# Patient Record
Sex: Male | Born: 1965 | Race: White | Hispanic: No | Marital: Married | Smoking: Former smoker
Health system: Southern US, Community
[De-identification: ages and names within clinical notes are randomized; demographics above are authoritative.]

## PROBLEM LIST (undated history)

## (undated) DIAGNOSIS — K219 Gastro-esophageal reflux disease without esophagitis: Secondary | ICD-10-CM

## (undated) DIAGNOSIS — K5900 Constipation, unspecified: Secondary | ICD-10-CM

## (undated) DIAGNOSIS — R519 Headache, unspecified: Secondary | ICD-10-CM

## (undated) DIAGNOSIS — R51 Headache: Secondary | ICD-10-CM

## (undated) DIAGNOSIS — I10 Essential (primary) hypertension: Secondary | ICD-10-CM

## (undated) DIAGNOSIS — M1990 Unspecified osteoarthritis, unspecified site: Secondary | ICD-10-CM

## (undated) HISTORY — PX: UPPER GASTROINTESTINAL ENDOSCOPY: SHX188

## (undated) HISTORY — DX: Constipation, unspecified: K59.00

## (undated) HISTORY — DX: Essential (primary) hypertension: I10

## (undated) HISTORY — DX: Headache, unspecified: R51.9

## (undated) HISTORY — DX: Gastro-esophageal reflux disease without esophagitis: K21.9

## (undated) HISTORY — DX: Headache: R51

## (undated) HISTORY — DX: Unspecified osteoarthritis, unspecified site: M19.90

---

## 2004-08-30 LAB — HM COLONOSCOPY: HM COLON: NORMAL

## 2004-11-11 HISTORY — PX: COLONOSCOPY: SHX174

## 2005-06-07 ENCOUNTER — Ambulatory Visit: Payer: Self-pay | Admitting: Gastroenterology

## 2006-07-03 ENCOUNTER — Ambulatory Visit: Payer: Self-pay | Admitting: Physician Assistant

## 2013-08-03 ENCOUNTER — Ambulatory Visit: Payer: Self-pay | Admitting: Adult Health

## 2013-08-05 ENCOUNTER — Encounter: Payer: Self-pay | Admitting: Adult Health

## 2013-08-05 ENCOUNTER — Ambulatory Visit (INDEPENDENT_AMBULATORY_CARE_PROVIDER_SITE_OTHER): Payer: BC Managed Care – PPO | Admitting: Adult Health

## 2013-08-05 VITALS — BP 110/72 | HR 58 | Temp 98.0°F | Resp 12 | Ht 69.5 in | Wt 225.5 lb

## 2013-08-05 DIAGNOSIS — R9431 Abnormal electrocardiogram [ECG] [EKG]: Secondary | ICD-10-CM

## 2013-08-05 DIAGNOSIS — K5900 Constipation, unspecified: Secondary | ICD-10-CM

## 2013-08-05 DIAGNOSIS — E291 Testicular hypofunction: Secondary | ICD-10-CM

## 2013-08-05 DIAGNOSIS — N529 Male erectile dysfunction, unspecified: Secondary | ICD-10-CM | POA: Insufficient documentation

## 2013-08-05 DIAGNOSIS — R7989 Other specified abnormal findings of blood chemistry: Secondary | ICD-10-CM

## 2013-08-05 DIAGNOSIS — K219 Gastro-esophageal reflux disease without esophagitis: Secondary | ICD-10-CM

## 2013-08-05 DIAGNOSIS — R079 Chest pain, unspecified: Secondary | ICD-10-CM

## 2013-08-05 DIAGNOSIS — Z0000 Encounter for general adult medical examination without abnormal findings: Secondary | ICD-10-CM

## 2013-08-05 LAB — CBC WITH DIFFERENTIAL/PLATELET
Basophils Absolute: 0 10*3/uL (ref 0.0–0.1)
Basophils Relative: 0.5 % (ref 0.0–3.0)
Eosinophils Absolute: 0.1 10*3/uL (ref 0.0–0.7)
Hemoglobin: 14 g/dL (ref 13.0–17.0)
Lymphocytes Relative: 37 % (ref 12.0–46.0)
MCHC: 34 g/dL (ref 30.0–36.0)
Monocytes Relative: 7.1 % (ref 3.0–12.0)
Neutro Abs: 2.4 10*3/uL (ref 1.4–7.7)
Neutrophils Relative %: 52.2 % (ref 43.0–77.0)
RBC: 4.61 Mil/uL (ref 4.22–5.81)
RDW: 12.6 % (ref 11.5–14.6)

## 2013-08-05 LAB — HEPATIC FUNCTION PANEL
ALT: 33 U/L (ref 0–53)
AST: 28 U/L (ref 0–37)
Albumin: 4.2 g/dL (ref 3.5–5.2)
Total Protein: 6.9 g/dL (ref 6.0–8.3)

## 2013-08-05 LAB — TSH: TSH: 1.24 u[IU]/mL (ref 0.35–5.50)

## 2013-08-05 LAB — VITAMIN B12: Vitamin B-12: 402 pg/mL (ref 211–911)

## 2013-08-05 NOTE — Assessment & Plan Note (Signed)
Patient is well controlled on the omeprazole. Continue to follow

## 2013-08-05 NOTE — Assessment & Plan Note (Signed)
EKG done secondary to complaints of unspecific complaints of chest pain. EKG shows Q waves in leads II, III and aVF. Will refer to cardiology for further evaluation.

## 2013-08-05 NOTE — Assessment & Plan Note (Signed)
On specific complaints of chest pain occurring "on and off". EKG was abnormal showing Q waves in multiple leads. Refer to cardiology for further evaluation and possible stress test.

## 2013-08-05 NOTE — Assessment & Plan Note (Signed)
The amount of time patient reports between bowel movements is too long. He has been taking fiber supplements and stool softeners. Occasionally takes MiraLax. Discussed that he is able to take MiraLax on a daily basis and adjust dose to appropriate stool consistency. He may take the MiraLax every other day or every 2 days but going for 2 weeks without a bowel movement is too long. Patient agreed and will try the MiraLax more regularly.
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
## 2013-08-05 NOTE — Assessment & Plan Note (Signed)
Physical exam was normal except for problems listed separately. Patient had health screening through his wife's insurance. Labs show hemoglobin A1c 5.7 (prediabetes), creatinine and GFR were normal, lipids were normal, BMI 33 considered obesity. I will order CBC with differential, TSH, B12, hepatic panel. Patient is being referred to cardiology for abnormal EKG. Referral to urology for erectile dysfunction and low testosterone.

## 2013-08-05 NOTE — Assessment & Plan Note (Signed)
Patient has been experiencing issues with erectile dysfunction. He had his testosterone checked with his previous PCP and patient was diagnosed with low testosterone and started on replacement therapy. He reacted significantly to the testosterone and has discontinued medication. He is no longer interested in replacing testosterone but is interested in discussing other options regarding his erectile dysfunction. Referral to urology.

## 2013-08-06 ENCOUNTER — Encounter: Payer: Self-pay | Admitting: Cardiovascular Disease

## 2013-08-06 ENCOUNTER — Ambulatory Visit (INDEPENDENT_AMBULATORY_CARE_PROVIDER_SITE_OTHER): Payer: BC Managed Care – PPO | Admitting: Cardiovascular Disease

## 2013-08-06 ENCOUNTER — Encounter: Payer: Self-pay | Admitting: *Deleted

## 2013-08-06 VITALS — BP 122/98 | HR 63 | Ht 69.5 in | Wt 224.8 lb

## 2013-08-06 DIAGNOSIS — R9431 Abnormal electrocardiogram [ECG] [EKG]: Secondary | ICD-10-CM

## 2013-08-06 DIAGNOSIS — R079 Chest pain, unspecified: Secondary | ICD-10-CM

## 2013-08-06 NOTE — Patient Instructions (Addendum)
Your physician has requested that you have an echocardiogram. Echocardiography is a painless test that uses sound waves to create images of your heart. It provides your doctor with information about the size and shape of your heart and how well your heart's chambers and valves are working. This procedure takes approximately one hour. There are no restrictions for this procedure.  Follow up as needed 

## 2013-08-10 ENCOUNTER — Encounter: Payer: Self-pay | Admitting: Cardiovascular Disease

## 2013-08-10 NOTE — Progress Notes (Signed)
Thank you for seeing Austin [name].

## 2013-08-10 NOTE — Assessment & Plan Note (Signed)
His chest pain seems to be noncardiac in suggestive of musculoskeletal etiology. He has been doing high level of exercise with no exertional symptoms. Thus, the utility of stress testing is low.
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
## 2013-08-10 NOTE — Assessment & Plan Note (Signed)
He has borderline inferior Q waves. It is mostly prominent in the 3. The Q waves in lead 2 and aVF might not be pathologic. I will request an echocardiogram to evaluate wall motion and LV systolic function.

## 2013-08-13 ENCOUNTER — Other Ambulatory Visit: Payer: Self-pay

## 2013-08-13 ENCOUNTER — Other Ambulatory Visit (INDEPENDENT_AMBULATORY_CARE_PROVIDER_SITE_OTHER): Payer: BC Managed Care – PPO

## 2013-08-13 DIAGNOSIS — R079 Chest pain, unspecified: Secondary | ICD-10-CM

## 2013-08-13 DIAGNOSIS — R9431 Abnormal electrocardiogram [ECG] [EKG]: Secondary | ICD-10-CM

## 2013-08-18 ENCOUNTER — Telehealth: Payer: Self-pay

## 2013-08-18 NOTE — Telephone Encounter (Signed)
Left detailed message on home machine of results. Asked pt to call with any questions or concerns. 

## 2013-08-18 NOTE — Telephone Encounter (Signed)
Message copied by Marilynne Halsted on Wed Aug 18, 2013  9:07 AM ------      Message from: Lorine Bears A      Created: Tue Aug 17, 2013  5:36 PM       Inform patient that echo was normal. ------

## 2013-08-26 DIAGNOSIS — E291 Testicular hypofunction: Secondary | ICD-10-CM | POA: Insufficient documentation

## 2013-08-26 DIAGNOSIS — N401 Enlarged prostate with lower urinary tract symptoms: Secondary | ICD-10-CM | POA: Insufficient documentation

## 2013-08-31 ENCOUNTER — Other Ambulatory Visit: Payer: Self-pay | Admitting: Adult Health

## 2013-08-31 MED ORDER — DOCUSATE SODIUM 100 MG PO CAPS: 100.0000 mg | ORAL_CAPSULE | Freq: Every day | ORAL | Status: DC

## 2013-08-31 MED ORDER — OMEPRAZOLE 20 MG PO CPDR: 20.0000 mg | DELAYED_RELEASE_CAPSULE | Freq: Every day | ORAL | Status: DC

## 2013-08-31 MED ORDER — PSYLLIUM 0.52 G PO CAPS: 0.5200 g | ORAL_CAPSULE | Freq: Every day | ORAL | Status: DC

## 2013-08-31 MED ORDER — MELOXICAM 7.5 MG PO TABS: 7.5000 mg | ORAL_TABLET | Freq: Two times a day (BID) | ORAL | Status: DC

## 2013-08-31 MED ORDER — POLYETHYLENE GLYCOL 3350 17 G PO PACK: 17.0000 g | PACK | Freq: Every day | ORAL | Status: DC | PRN

## 2013-08-31 NOTE — Telephone Encounter (Signed)
Wife called back, stating Rxs for fiber, stool softener, and Miralax needed to go to [facility] and Omeprazole and Meloxicam to [facility]

## 2013-08-31 NOTE — Telephone Encounter (Signed)
States she has left msgs on vm regarding refills.  Pt was recently new to [place] and is needing refills on his meds.  Previously prescribed meds by Dr. Robb Matar.  States pt turned in a list at his appointment that included where the prescriptions and where they were to be called in to.  States OTC meds still need a script so she can use her flex card including miralax so she can use her flex card to pay for them.  Please contact Inetta Fermo (wife) regarding the refills.  Please also contact to let Inetta Fermo know when this has been done.  States he is very low on meds.

## 2013-08-31 NOTE — Telephone Encounter (Signed)
Yes Ok to refill.

## 2013-08-31 NOTE — Telephone Encounter (Signed)
Left message for pt's wife to return my call, regarding pharmacy Rxs need to go to.

## 2013-08-31 NOTE — Telephone Encounter (Signed)
Ok to refill Meloxicam to [facility]?

## 2013-09-07 ENCOUNTER — Telehealth: Payer: Self-pay | Admitting: *Deleted

## 2013-09-07 NOTE — Telephone Encounter (Signed)
Pt has not been evaluated for this. I cannot prescribe without seeing him.

## 2013-09-07 NOTE — Telephone Encounter (Signed)
Pt's wife called, states pt has a history of dry mouth in mornings, occasionally will get ulcers in his mouth. Occurs off and on for years, lasts 1-2 weeks when they occur. She is asking for [facility] mouthwash to be called in without pt having to be seen. He has been gargling with salt water but no relief.

## 2013-09-08 ENCOUNTER — Ambulatory Visit (INDEPENDENT_AMBULATORY_CARE_PROVIDER_SITE_OTHER): Payer: BC Managed Care – PPO | Admitting: Adult Health

## 2013-09-08 ENCOUNTER — Encounter: Payer: Self-pay | Admitting: Adult Health

## 2013-09-08 ENCOUNTER — Telehealth: Payer: Self-pay | Admitting: Emergency Medicine

## 2013-09-08 VITALS — BP 110/76 | HR 92 | Temp 98.1°F | Resp 12 | Wt 224.0 lb

## 2013-09-08 DIAGNOSIS — K120 Recurrent oral aphthae: Secondary | ICD-10-CM

## 2013-09-08 MED ORDER — FIRST-DUKES MOUTHWASH MT SUSP: OROMUCOSAL | Status: DC

## 2013-09-08 NOTE — Progress Notes (Signed)
  Subjective:    Patient ID: Austin [name], male    DOB: [birth year], 47 y.o.   MRN: [id number]  HPI  Patient is a pleasant 47 year old male who reports painful, small ulcerations inside his mouth. He has had 3 total in the past week. Two have healed. The one underneath his tongue is still very painful. He has been using Chloraseptic to help numb the area. He reports that he used to get a lot of these ulcerations as a child. He has not gotten many episodes of these as an adult. He denies recent illness. He also denies any unusual foods that may have been a trigger. He reports being under considerable amount of stress related to work.  Current Outpatient Prescriptions on File Prior to Visit  Medication Sig Dispense Refill  . B Complex-C (SUPER B COMPLEX PO) Take 1 tablet by mouth daily.      Marland Kitchen docusate sodium (COLACE) 100 MG capsule Take 1 capsule (100 mg total) by mouth daily.  30 capsule  5  . ibuprofen (ADVIL,MOTRIN) 200 MG tablet Take 200 mg by mouth every 6 (six) hours as needed for pain.      . meloxicam (MOBIC) 7.5 MG tablet Take 1 tablet (7.5 mg total) by mouth 2 (two) times daily.  180 tablet  1  . omeprazole (PRILOSEC) 20 MG capsule Take 1 capsule (20 mg total) by mouth daily.  90 capsule  1  . polyethylene glycol (MIRALAX / GLYCOLAX) packet Take 17 g by mouth daily as needed.  14 each  5  . psyllium (EQ FIBER THERAPY) 0.52 G capsule Take 1 capsule (0.52 g total) by mouth daily.  30 capsule  5   No current facility-administered medications on file prior to visit.     Review of Systems  Constitutional: Negative.   HENT: Positive for mouth sores.   Gastrointestinal: Negative.        Objective:   Physical Exam  Constitutional: He is oriented to person, place, and time. He appears well-developed and well-nourished. No distress.  HENT:  Aphthous ulcer underneath tongue  Neurological: He is alert and oriented to person, place, and time.  Skin: Skin is warm and dry.  Psychiatric: He  has a normal mood and affect. His behavior is normal. Judgment and thought content normal.          Assessment & Plan:

## 2013-09-08 NOTE — Assessment & Plan Note (Signed)
[facility] mouth wash swish and spit 4 times a day. May use Anbesol or Orajel to numb the area prior to eating.

## 2013-09-08 NOTE — Telephone Encounter (Signed)
Left message for pt, advising to call for appointment to be evaluated.

## 2013-09-08 NOTE — Telephone Encounter (Signed)
Pt wife aware patient needs to come in before anything can be called in for his mouth. Pt has an apt at 415 today.

## 2013-09-08 NOTE — Patient Instructions (Signed)
  Rinse mouth 4 times a day with [facility] Mouthwash.  Use orajel or anbesol to numb the area prior to eating.

## 2013-09-09 ENCOUNTER — Other Ambulatory Visit: Payer: Self-pay | Admitting: *Deleted

## 2013-09-09 ENCOUNTER — Ambulatory Visit: Payer: BC Managed Care – PPO | Admitting: Adult Health

## 2013-09-16 ENCOUNTER — Other Ambulatory Visit: Payer: Self-pay

## 2013-11-10 ENCOUNTER — Other Ambulatory Visit: Payer: Self-pay | Admitting: Adult Health

## 2013-11-15 ENCOUNTER — Encounter: Payer: Self-pay | Admitting: Adult Health

## 2014-01-11 ENCOUNTER — Other Ambulatory Visit: Payer: Self-pay | Admitting: Adult Health

## 2014-01-11 ENCOUNTER — Encounter: Payer: Self-pay | Admitting: Internal Medicine

## 2014-01-11 ENCOUNTER — Ambulatory Visit (INDEPENDENT_AMBULATORY_CARE_PROVIDER_SITE_OTHER): Payer: BC Managed Care – PPO | Admitting: Internal Medicine

## 2014-01-11 VITALS — BP 120/90 | HR 88 | Temp 98.1°F | Wt 226.0 lb

## 2014-01-11 DIAGNOSIS — A088 Other specified intestinal infections: Secondary | ICD-10-CM

## 2014-01-11 DIAGNOSIS — A084 Viral intestinal infection, unspecified: Secondary | ICD-10-CM

## 2014-01-11 MED ORDER — ONDANSETRON HCL 8 MG PO TABS: 8.0000 mg | ORAL_TABLET | Freq: Three times a day (TID) | ORAL | Status: DC | PRN

## 2014-01-11 MED ORDER — OMEPRAZOLE 20 MG PO CPDR: 20.0000 mg | DELAYED_RELEASE_CAPSULE | Freq: Every day | ORAL | Status: DC

## 2014-01-11 MED ORDER — MELOXICAM 7.5 MG PO TABS
7.5000 mg | ORAL_TABLET | Freq: Two times a day (BID) | ORAL | Status: DC
Start: 2014-01-11 — End: 2014-09-15

## 2014-01-11 NOTE — Patient Instructions (Signed)
Viral Gastroenteritis Viral gastroenteritis is also known as stomach flu. This condition affects the stomach and intestinal tract. It can cause sudden diarrhea and vomiting. The illness typically lasts 3 to 8 days. Most people develop an immune response that eventually gets rid of the virus. While this natural response develops, the virus can make you quite ill. CAUSES  Many different viruses can cause gastroenteritis, such as rotavirus or noroviruses. You can catch one of these viruses by consuming contaminated food or water. You may also catch a virus by sharing utensils or other personal items with an infected person or by touching a contaminated surface. SYMPTOMS  The most common symptoms are diarrhea and vomiting. These problems can cause a severe loss of body fluids (dehydration) and a body salt (electrolyte) imbalance. Other symptoms may include:  Fever.  Headache.  Fatigue.  Abdominal pain. DIAGNOSIS  Your caregiver can usually diagnose viral gastroenteritis based on your symptoms and a physical exam. A stool sample may also be taken to test for the presence of viruses or other infections. TREATMENT  This illness typically goes away on its own. Treatments are aimed at rehydration. The most serious cases of viral gastroenteritis involve vomiting so severely that you are not able to keep fluids down. In these cases, fluids must be given through an intravenous line (IV). HOME CARE INSTRUCTIONS   Drink enough fluids to keep your urine clear or pale yellow. Drink small amounts of fluids frequently and increase the amounts as tolerated.  Ask your caregiver for specific rehydration instructions.  Avoid:  Foods high in sugar.  Alcohol.  Carbonated drinks.  Tobacco.  Juice.  Caffeine drinks.  Extremely hot or cold fluids.  Fatty, greasy foods.  Too much intake of anything at one time.  Dairy products until 24 to 48 hours after diarrhea stops.  You may consume probiotics.  Probiotics are active cultures of beneficial bacteria. They may lessen the amount and number of diarrheal stools in adults. Probiotics can be found in yogurt with active cultures and in supplements.  Wash your hands well to avoid spreading the virus.  Only take over-the-counter or prescription medicines for pain, discomfort, or fever as directed by your caregiver. Do not give aspirin to children. Antidiarrheal medicines are not recommended.  Ask your caregiver if you should continue to take your regular prescribed and over-the-counter medicines.  Keep all follow-up appointments as directed by your caregiver. SEEK IMMEDIATE MEDICAL CARE IF:   You are unable to keep fluids down.  You do not urinate at least once every 6 to 8 hours.  You develop shortness of breath.  You notice blood in your stool or vomit. This may look like coffee grounds.  You have abdominal pain that increases or is concentrated in one small area (localized).  You have persistent vomiting or diarrhea.  You have a fever.  The patient is a child younger than 3 months, and he or she has a fever.  The patient is a child older than 3 months, and he or she has a fever and persistent symptoms.  The patient is a child older than 3 months, and he or she has a fever and symptoms suddenly get worse.  The patient is a baby, and he or she has no tears when crying. MAKE SURE YOU:   Understand these instructions.  Will watch your condition.  Will get help right away if you are not doing well or get worse. Document Released: 10/28/2005 Document Revised: 01/20/2012 Document Reviewed: 08/14/2011   Exit[employer] Patient Information 2014 [employer]  

## 2014-01-11 NOTE — Progress Notes (Signed)
   Subjective:    Patient ID: Austin [name], male    DOB: 1965-12-15, 48 y.o.   MRN: [id number]  HPI 48YO male presents for acute visit. Abdominal pain and vomiting starting last night. Emesis with food particles. Temp 101F. Slept through the night. Stomach pain improved today. No diarrhea. Not taking anything for nausea. Took Mucinex. Tolerating liquids this morning, however appetite poor. Coworker was sick with similar symptoms 2-3 days ago.  Review of Systems  Constitutional: Positive for fever, appetite change and fatigue. Negative for chills, activity change and unexpected weight change.  HENT: Negative for congestion and postnasal drip.   Eyes: Negative for visual disturbance.  Respiratory: Negative for cough and shortness of breath.   Cardiovascular: Negative for chest pain, palpitations and leg swelling.  Gastrointestinal: Positive for nausea and vomiting. Negative for abdominal pain, diarrhea, constipation, blood in stool and abdominal distention.  Genitourinary: Negative for dysuria, urgency and difficulty urinating.  Musculoskeletal: Negative for arthralgias and gait problem.  Skin: Negative for color change and rash.  Hematological: Negative for adenopathy.  Psychiatric/Behavioral: Negative for sleep disturbance and dysphoric mood. The patient is not nervous/anxious.        Objective:    BP 120/90  Pulse 88  Temp(Src) 98.1 F (36.7 C) (Oral)  Wt 226 lb (102.513 kg)  SpO2 96% Physical Exam  Constitutional: He is oriented to person, place, and time. He appears well-developed and well-nourished. No distress.  HENT:  Head: Normocephalic and atraumatic.  Right Ear: External ear normal.  Left Ear: External ear normal.  Nose: Nose normal.  Mouth/Throat: Oropharynx is clear and moist. No oropharyngeal exudate.  Eyes: Conjunctivae and EOM are normal. Pupils are equal, round, and reactive to light. Right eye exhibits no discharge. Left eye exhibits no discharge. No scleral  icterus.  Neck: Normal range of motion. Neck supple. No tracheal deviation present. No thyromegaly present.  Cardiovascular: Normal rate, regular rhythm and normal heart sounds.  Exam reveals no gallop and no friction rub.   No murmur heard. Pulmonary/Chest: Effort normal and breath sounds normal. No accessory muscle usage. Not tachypneic. No respiratory distress. He has no decreased breath sounds. He has no wheezes. He has no rhonchi. He has no rales. He exhibits no tenderness.  Abdominal: Soft. Bowel sounds are normal. He exhibits no distension and no mass. There is tenderness (diffuse mild). There is no rebound and no guarding.  Musculoskeletal: Normal range of motion. He exhibits no edema.  Lymphadenopathy:    He has no cervical adenopathy.  Neurological: He is alert and oriented to person, place, and time. No cranial nerve deficit. Coordination normal.  Skin: Skin is warm and dry. No rash noted. He is not diaphoretic. No erythema. No pallor.  Psychiatric: He has a normal mood and affect. His behavior is normal. Judgment and thought content normal.          Assessment & Plan:   Problem List Items Addressed This Visit   Viral gastroenteritis - Primary     Symptoms and exam most consistent with viral gastroenteritis. Encouraged rest, fluid intake as tolerated. Will start Ondansetron for nausea. Follow up if symptoms persistent or worsening, or if unable to tolerate liquids.    Relevant Medications      ondansetron (ZOFRAN) tablet       Return if symptoms worsen or fail to improve.

## 2014-01-12 DIAGNOSIS — A084 Viral intestinal infection, unspecified: Secondary | ICD-10-CM | POA: Insufficient documentation

## 2014-01-12 NOTE — Assessment & Plan Note (Signed)
Symptoms and exam most consistent with viral gastroenteritis. Encouraged rest, fluid intake as tolerated. Will start Ondansetron for nausea. Follow up if symptoms persistent or worsening, or if unable to tolerate liquids.

## 2014-06-07 ENCOUNTER — Encounter: Payer: Self-pay | Admitting: Adult Health

## 2014-06-07 ENCOUNTER — Ambulatory Visit (INDEPENDENT_AMBULATORY_CARE_PROVIDER_SITE_OTHER): Payer: BC Managed Care – PPO | Admitting: Adult Health

## 2014-06-07 VITALS — BP 132/87 | HR 61 | Temp 97.8°F | Resp 14 | Ht 69.5 in | Wt 220.5 lb

## 2014-06-07 DIAGNOSIS — J020 Streptococcal pharyngitis: Secondary | ICD-10-CM

## 2014-06-07 DIAGNOSIS — K120 Recurrent oral aphthae: Secondary | ICD-10-CM

## 2014-06-07 LAB — POCT RAPID STREP A (OFFICE): RAPID STREP A SCREEN: NEGATIVE

## 2014-06-07 MED ORDER — NYSTATIN 100000 UNIT/ML MT SUSP: 5.0000 mL | Freq: Four times a day (QID) | OROMUCOSAL | Status: DC

## 2014-06-07 MED ORDER — FIRST-DUKES MOUTHWASH MT SUSP: OROMUCOSAL | Status: DC

## 2014-06-07 NOTE — Progress Notes (Signed)
Pre visit review using our clinic review tool, if applicable. No additional management support is needed unless otherwise documented below in the visit note. 
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
## 2014-06-10 ENCOUNTER — Other Ambulatory Visit: Payer: Self-pay | Admitting: Adult Health

## 2014-06-10 ENCOUNTER — Encounter: Payer: Self-pay | Admitting: Adult Health

## 2014-06-10 MED ORDER — AMOXICILLIN-POT CLAVULANATE 875-125 MG PO TABS: 1.0000 | ORAL_TABLET | Freq: Two times a day (BID) | ORAL | Status: DC

## 2014-08-30 ENCOUNTER — Encounter: Payer: Self-pay | Admitting: Internal Medicine

## 2014-08-30 ENCOUNTER — Ambulatory Visit (INDEPENDENT_AMBULATORY_CARE_PROVIDER_SITE_OTHER): Payer: BC Managed Care – PPO | Admitting: Internal Medicine

## 2014-08-30 VITALS — BP 118/82 | HR 70 | Temp 97.6°F | Ht 69.5 in | Wt 215.8 lb

## 2014-08-30 DIAGNOSIS — K5900 Constipation, unspecified: Secondary | ICD-10-CM

## 2014-08-30 DIAGNOSIS — E669 Obesity, unspecified: Secondary | ICD-10-CM | POA: Insufficient documentation

## 2014-08-30 DIAGNOSIS — Z23 Encounter for immunization: Secondary | ICD-10-CM

## 2014-08-30 MED ORDER — LINACLOTIDE 145 MCG PO CAPS: 145.0000 ug | ORAL_CAPSULE | Freq: Every day | ORAL | Status: DC

## 2014-08-30 MED ORDER — POLYETHYLENE GLYCOL 3350 17 G PO PACK: 17.0000 g | PACK | Freq: Every day | ORAL | Status: DC | PRN

## 2014-08-30 NOTE — Assessment & Plan Note (Signed)
Wt Readings from Last 3 Encounters:  08/30/14 215 lb 12 oz (97.864 kg)  06/07/14 220 lb 8 oz (100.018 kg)  01/11/14 226 lb (102.513 kg)   Body mass index is 31.41 kg/(m^2). Encouraged healthy diet and exercise. Goal 10lb weight loss over 3 months.

## 2014-08-30 NOTE — Progress Notes (Signed)
Pre visit review using our clinic review tool, if applicable. No additional management support is needed unless otherwise documented below in the visit note. 

## 2014-08-30 NOTE — Progress Notes (Signed)
Subjective:    Patient ID: Austin [name], male    DOB: Dec 09, 1965, 48 y.o.   MRN: [id number]  HPI 48YO male presents to establish care.  Obesity - started lifting weights 3 nights per week and exercising to lose weight. Following Weight Watchers diet with wife. Lost 5lbs.  Constipation- Has chronic issues with constipation. Taking Miralax daily, fiber supplement and has increased fluid intake with no improvement. Typically, has BMP 1x per week. No blood in stool. No black stool. Previous colonoscopy to evaluate this in approx 2005 was normal.  Review of Systems  Constitutional: Negative for fever, chills, activity change, appetite change, fatigue and unexpected weight change.  Eyes: Negative for visual disturbance.  Respiratory: Negative for cough and shortness of breath.   Cardiovascular: Negative for chest pain, palpitations and leg swelling.  Gastrointestinal: Positive for constipation. Negative for nausea, vomiting, abdominal pain, diarrhea, blood in stool and abdominal distention.  Genitourinary: Negative for dysuria, urgency and difficulty urinating.  Musculoskeletal: Negative for arthralgias and gait problem.  Skin: Negative for color change and rash.  Hematological: Negative for adenopathy.  Psychiatric/Behavioral: Negative for sleep disturbance and dysphoric mood. The patient is not nervous/anxious.        Objective:    BP 118/82  Pulse 70  Temp(Src) 97.6 F (36.4 C) (Oral)  Ht 5' 9.5" (1.765 m)  Wt 215 lb 12 oz (97.864 kg)  BMI 31.41 kg/m2  SpO2 97% Physical Exam  Constitutional: He is oriented to person, place, and time. He appears well-developed and well-nourished. No distress.  HENT:  Head: Normocephalic and atraumatic.  Right Ear: External ear normal.  Left Ear: External ear normal.  Nose: Nose normal.  Mouth/Throat: Oropharynx is clear and moist. No oropharyngeal exudate.  Eyes: Conjunctivae and EOM are normal. Pupils are equal, round, and reactive to light.  Right eye exhibits no discharge. Left eye exhibits no discharge. No scleral icterus.  Neck: Normal range of motion. Neck supple. No tracheal deviation present. No thyromegaly present.  Cardiovascular: Normal rate, regular rhythm and normal heart sounds.  Exam reveals no gallop and no friction rub.   No murmur heard. Pulmonary/Chest: Effort normal and breath sounds normal. No accessory muscle usage. Not tachypneic. No respiratory distress. He has no decreased breath sounds. He has no wheezes. He has no rhonchi. He has no rales. He exhibits no tenderness.  Musculoskeletal: Normal range of motion. He exhibits no edema.  Lymphadenopathy:    He has no cervical adenopathy.  Neurological: He is alert and oriented to person, place, and time. No cranial nerve deficit. Coordination normal.  Skin: Skin is warm and dry. No rash noted. He is not diaphoretic. No erythema. No pallor.  Psychiatric: He has a normal mood and affect. His behavior is normal. Judgment and thought content normal.          Assessment & Plan:   Problem List Items Addressed This Visit     Unprioritized   Constipation - Primary     Chronic constipation. Will try adding Linzess. Follow up by email and in 3 months.    Obesity (BMI 30-39.9)      Wt Readings from Last 3 Encounters:  08/30/14 215 lb 12 oz (97.864 kg)  06/07/14 220 lb 8 oz (100.018 kg)  01/11/14 226 lb (102.513 kg)   Body mass index is 31.41 kg/(m^2). Encouraged healthy diet and exercise. Goal 10lb weight loss over 3 months.        Return in about 3 months (around 11/30/2014)  for Recheck.

## 2014-08-30 NOTE — Assessment & Plan Note (Signed)
Chronic constipation. Will try adding Linzess. Follow up by email and in 3 months.

## 2014-08-30 NOTE — Patient Instructions (Signed)
Start Linzess 123mcg daily to help with constipation.  Flu vaccine today.

## 2014-09-02 ENCOUNTER — Encounter: Payer: Self-pay | Admitting: *Deleted

## 2014-09-02 ENCOUNTER — Encounter: Payer: Self-pay | Admitting: Internal Medicine

## 2014-09-15 ENCOUNTER — Other Ambulatory Visit: Payer: Self-pay | Admitting: *Deleted

## 2014-09-15 MED ORDER — OMEPRAZOLE 20 MG PO CPDR: 20.0000 mg | DELAYED_RELEASE_CAPSULE | Freq: Every day | ORAL | Status: DC

## 2014-09-15 MED ORDER — MELOXICAM 7.5 MG PO TABS: 7.5000 mg | ORAL_TABLET | Freq: Two times a day (BID) | ORAL | Status: DC

## 2014-09-15 NOTE — Telephone Encounter (Signed)
Ok refill? 

## 2014-09-19 ENCOUNTER — Telehealth: Payer: Self-pay

## 2014-09-19 ENCOUNTER — Other Ambulatory Visit: Payer: Self-pay

## 2014-09-19 NOTE — Telephone Encounter (Signed)
Add tramadol 50mg  to patient's medication list as requested.

## 2014-09-20 ENCOUNTER — Telehealth: Payer: Self-pay | Admitting: *Deleted

## 2014-09-20 MED ORDER — TRAMADOL HCL 50 MG PO TABS: 50.0000 mg | ORAL_TABLET | ORAL | Status: DC | PRN

## 2014-09-20 NOTE — Telephone Encounter (Signed)
This is historical medication, need the quantity and number of refills

## 2014-09-20 NOTE — Telephone Encounter (Signed)
Pt called requesting a refill on Tramadol, this is a historical medication, Okay to fill?

## 2014-09-20 NOTE — Telephone Encounter (Signed)
Fine to refill 

## 2014-09-27 ENCOUNTER — Ambulatory Visit: Payer: BC Managed Care – PPO | Admitting: Internal Medicine

## 2014-11-30 ENCOUNTER — Ambulatory Visit (INDEPENDENT_AMBULATORY_CARE_PROVIDER_SITE_OTHER): Payer: BC Managed Care – PPO | Admitting: Internal Medicine

## 2014-11-30 ENCOUNTER — Encounter: Payer: Self-pay | Admitting: Internal Medicine

## 2014-11-30 VITALS — BP 113/76 | HR 60 | Temp 97.4°F | Ht 69.5 in | Wt 224.2 lb

## 2014-11-30 DIAGNOSIS — K219 Gastro-esophageal reflux disease without esophagitis: Secondary | ICD-10-CM

## 2014-11-30 DIAGNOSIS — K5900 Constipation, unspecified: Secondary | ICD-10-CM

## 2014-11-30 DIAGNOSIS — E669 Obesity, unspecified: Secondary | ICD-10-CM

## 2014-11-30 MED ORDER — FIRST-DUKES MOUTHWASH MT SUSP: OROMUCOSAL | Status: DC

## 2014-11-30 NOTE — Progress Notes (Signed)
Subjective:    Patient ID: Austin [name], male    DOB: 27-Sep-1966, 49 y.o.   MRN: [id number]  HPI 49YO male presents for follow up.  Joined a local gym at [employer] and has been following a healthy diet. Going to gym about 4 days per week.  Constipation- Continues to struggle with constipation. Has a BM about every 4 days. At times has gone up to 2 weeks. Slightly improved with Linzess. Also taking Miralax and fiber supplements. Has tried to increase fluid intake.  Wt Readings from Last 3 Encounters:  11/30/14 224 lb 4 oz (101.719 kg)  08/30/14 215 lb 12 oz (97.864 kg)  06/07/14 220 lb 8 oz (100.018 kg)     Past medical, surgical, family and social history per today's encounter.  Review of Systems  Constitutional: Negative for fever, chills, activity change, appetite change, fatigue and unexpected weight change.  Eyes: Negative for visual disturbance.  Respiratory: Negative for cough and shortness of breath.   Cardiovascular: Negative for chest pain, palpitations and leg swelling.  Gastrointestinal: Positive for constipation. Negative for nausea, vomiting, abdominal pain, diarrhea, blood in stool, abdominal distention, anal bleeding and rectal pain.  Genitourinary: Negative for dysuria, urgency and difficulty urinating.  Musculoskeletal: Negative for arthralgias and gait problem.  Skin: Negative for color change and rash.  Hematological: Negative for adenopathy.  Psychiatric/Behavioral: Negative for sleep disturbance and dysphoric mood. The patient is not nervous/anxious.        Objective:    BP 113/76 mmHg  Pulse 60  Temp(Src) 97.4 F (36.3 C) (Oral)  Ht 5' 9.5" (1.765 m)  Wt 224 lb 4 oz (101.719 kg)  BMI 32.65 kg/m2  SpO2 96% Physical Exam  Constitutional: He is oriented to person, place, and time. He appears well-developed and well-nourished. No distress.  HENT:  Head: Normocephalic and atraumatic.  Right Ear: External ear normal.  Left Ear: External ear  normal.  Nose: Nose normal.  Mouth/Throat: Oropharynx is clear and moist. No oropharyngeal exudate.  Eyes: Conjunctivae and EOM are normal. Pupils are equal, round, and reactive to light. Right eye exhibits no discharge. Left eye exhibits no discharge. No scleral icterus.  Neck: Normal range of motion. Neck supple. No tracheal deviation present. No thyromegaly present.  Cardiovascular: Normal rate, regular rhythm and normal heart sounds.  Exam reveals no gallop and no friction rub.   No murmur heard. Pulmonary/Chest: Effort normal and breath sounds normal. No respiratory distress. He has no wheezes. He has no rales. He exhibits no tenderness.  Abdominal: Soft. Bowel sounds are normal. He exhibits no distension and no mass. There is tenderness (slight left lower quad). There is no rebound and no guarding.  Musculoskeletal: Normal range of motion. He exhibits no edema.  Lymphadenopathy:    He has no cervical adenopathy.  Neurological: He is alert and oriented to person, place, and time. No cranial nerve deficit. Coordination normal.  Skin: Skin is warm and dry. No rash noted. He is not diaphoretic. No erythema. No pallor.  Psychiatric: He has a normal mood and affect. His behavior is normal. Judgment and thought content normal.          Assessment & Plan:   Problem List Items Addressed This Visit      Unprioritized   Constipation - Primary    Symptoms of constipation persistent despite adding Linzess 126mcg daily. Will try increase to 257mcg daily, on weekends when he has better access to bathroom. He will follow up by email  next week. Discussed adding Amitiza and also discussed referral to GI for follow up colonoscopy. He declines referral for now.      GERD (gastroesophageal reflux disease)    Symptoms well controlled with omeprazole. Will continue.      Obesity (BMI 30-39.9)    Wt Readings from Last 3 Encounters:  11/30/14 224 lb 4 oz (101.719 kg)  08/30/14 215 lb 12 oz (97.864  kg)  06/07/14 220 lb 8 oz (100.018 kg)   Body mass index is 32.65 kg/(m^2). Encouraged continued efforts at healthy diet and exercise.          Return in about 5 months (around 05/01/2015) for Physical.

## 2014-11-30 NOTE — Assessment & Plan Note (Signed)
Symptoms well controlled with omeprazole. Will continue.

## 2014-11-30 NOTE — Assessment & Plan Note (Signed)
Symptoms of constipation persistent despite adding Linzess 124mcg daily. Will try increase to 252mcg daily, on weekends when he has better access to bathroom. He will follow up by email next week. Discussed adding Amitiza and also discussed referral to GI for follow up colonoscopy. He declines referral for now.

## 2014-11-30 NOTE — Patient Instructions (Addendum)
Try increasing Linzess to 234mcg daily on the weekends.  Email with update.  Consider colonoscopy.

## 2014-11-30 NOTE — Assessment & Plan Note (Signed)
Wt Readings from Last 3 Encounters:  11/30/14 224 lb 4 oz (101.719 kg)  08/30/14 215 lb 12 oz (97.864 kg)  06/07/14 220 lb 8 oz (100.018 kg)   Body mass index is 32.65 kg/(m^2). Encouraged continued efforts at healthy diet and exercise.

## 2014-11-30 NOTE — Progress Notes (Signed)
Pre visit review using our clinic review tool, if applicable. No additional management support is needed unless otherwise documented below in the visit note. 

## 2015-03-16 ENCOUNTER — Other Ambulatory Visit: Payer: Self-pay | Admitting: Internal Medicine

## 2015-03-20 ENCOUNTER — Ambulatory Visit (INDEPENDENT_AMBULATORY_CARE_PROVIDER_SITE_OTHER): Payer: BC Managed Care – PPO | Admitting: Internal Medicine

## 2015-03-20 ENCOUNTER — Encounter: Payer: Self-pay | Admitting: Internal Medicine

## 2015-03-20 VITALS — BP 130/73 | HR 62 | Temp 98.4°F | Ht 69.5 in | Wt 225.0 lb

## 2015-03-20 DIAGNOSIS — R41840 Attention and concentration deficit: Secondary | ICD-10-CM

## 2015-03-20 DIAGNOSIS — L989 Disorder of the skin and subcutaneous tissue, unspecified: Secondary | ICD-10-CM | POA: Diagnosis not present

## 2015-03-20 DIAGNOSIS — R1030 Lower abdominal pain, unspecified: Secondary | ICD-10-CM

## 2015-03-20 DIAGNOSIS — F909 Attention-deficit hyperactivity disorder, unspecified type: Secondary | ICD-10-CM | POA: Insufficient documentation

## 2015-03-20 DIAGNOSIS — R109 Unspecified abdominal pain: Secondary | ICD-10-CM | POA: Insufficient documentation

## 2015-03-20 NOTE — Assessment & Plan Note (Signed)
Dark macule left mid back. Will set up dermatology evaluation for biopsy.

## 2015-03-20 NOTE — Progress Notes (Signed)
Pre visit review using our clinic review tool, if applicable. No additional management support is needed unless otherwise documented below in the visit note. 

## 2015-03-20 NOTE — Patient Instructions (Signed)
We will set up testing for concentration deficits.

## 2015-03-20 NOTE — Assessment & Plan Note (Signed)
Reviewed notes from [facility]. CT abdomen showed no acute findings. Suspect renal stone passed prior to CT. Will continue to monitor for recurrent symptoms.

## 2015-03-20 NOTE — Assessment & Plan Note (Signed)
Long term issues with concentration. Never previously tested for ADD. Will set up testing with behavioral health.

## 2015-03-20 NOTE — Progress Notes (Signed)
Subjective:    Patient ID: Austin [name], male    DOB: [birth year], 49 y.o.   MRN: [id number]  HPI  49YO male presents for follow up.  Seen at [facility] ED 3/31 for severe lower pelvic pain x 2 weeks. Slowly resolved. CT showed no acute findings.  Concerned about concentration deficit. Trouble focusing at work and home. Does not recall information well. Feels "all over the place." Has never used medications for this. Struggling at work  Past medical, surgical, family and social history per today's encounter.  Review of Systems  Constitutional: Negative for fever, chills, activity change, appetite change, fatigue and unexpected weight change.  Eyes: Negative for visual disturbance.  Respiratory: Negative for cough and shortness of breath.   Cardiovascular: Negative for chest pain, palpitations and leg swelling.  Gastrointestinal: Negative for abdominal pain and abdominal distention.  Genitourinary: Negative for dysuria, urgency and difficulty urinating.  Musculoskeletal: Negative for arthralgias and gait problem.  Skin: Negative for color change and rash.  Hematological: Negative for adenopathy.  Psychiatric/Behavioral: Positive for behavioral problems and decreased concentration. Negative for sleep disturbance and dysphoric mood. The patient is nervous/anxious.        Objective:    BP 130/73 mmHg  Pulse 62  Temp(Src) 98.4 F (36.9 C) (Oral)  Ht 5' 9.5" (1.765 m)  Wt 225 lb (102.059 kg)  BMI 32.76 kg/m2  SpO2 97% Physical Exam  Constitutional: He is oriented to person, place, and time. He appears well-developed and well-nourished. No distress.  HENT:  Head: Normocephalic and atraumatic.  Right Ear: External ear normal.  Left Ear: External ear normal.  Nose: Nose normal.  Mouth/Throat: Oropharynx is clear and moist. No oropharyngeal exudate.  Eyes: Conjunctivae and EOM are normal. Pupils are equal, round, and reactive to light. Right eye exhibits no discharge. Left eye exhibits  no discharge. No scleral icterus.  Neck: Normal range of motion. Neck supple. No tracheal deviation present. No thyromegaly present.  Cardiovascular: Normal rate, regular rhythm and normal heart sounds.  Exam reveals no gallop and no friction rub.   No murmur heard. Pulmonary/Chest: Effort normal and breath sounds normal. No accessory muscle usage. No tachypnea. No respiratory distress. He has no decreased breath sounds. He has no wheezes. He has no rhonchi. He has no rales. He exhibits no tenderness.  Musculoskeletal: Normal range of motion. He exhibits no edema.  Lymphadenopathy:    He has no cervical adenopathy.  Neurological: He is alert and oriented to person, place, and time. No cranial nerve deficit. Coordination normal.  Skin: Skin is warm and dry. No rash noted. He is not diaphoretic. No erythema. No pallor.     Psychiatric: He has a normal mood and affect. His behavior is normal. Judgment and thought content normal.          Assessment & Plan:   Problem List Items Addressed This Visit      Unprioritized   Abdominal pain    Reviewed notes from [facility]. CT abdomen showed no acute findings. Suspect renal stone passed prior to CT. Will continue to monitor for recurrent symptoms.      Concentration deficit - Primary    Long term issues with concentration. Never previously tested for ADD. Will set up testing with behavioral health.      Relevant Orders   Ambulatory referral to Psychology   Skin lesion    Dark macule left mid back. Will set up dermatology evaluation for biopsy.      Relevant Orders  Ambulatory referral to Dermatology       Return in about 4 weeks (around 04/17/2015) for Recheck.

## 2015-03-28 ENCOUNTER — Other Ambulatory Visit: Payer: Self-pay | Admitting: Internal Medicine

## 2015-03-28 ENCOUNTER — Telehealth: Payer: Self-pay | Admitting: *Deleted

## 2015-03-28 NOTE — Telephone Encounter (Signed)
Last OV 5.9.16, last refill 3.21.16.  Please advise refill

## 2015-03-28 NOTE — Telephone Encounter (Signed)
rx faxed

## 2015-03-28 NOTE — Telephone Encounter (Signed)
Pt asking about psychology referral at [place] office.  Needs this appt scheduled before his June 3rd appt with Dr Gilford Rile

## 2015-03-30 ENCOUNTER — Telehealth: Payer: Self-pay

## 2015-03-30 NOTE — Telephone Encounter (Signed)
The pt called and is hoping to have a call back when your available to discuss his referral. Thanks!

## 2015-04-06 ENCOUNTER — Encounter: Payer: Self-pay | Admitting: Internal Medicine

## 2015-04-14 ENCOUNTER — Encounter: Payer: BC Managed Care – PPO | Admitting: Internal Medicine

## 2015-04-23 ENCOUNTER — Other Ambulatory Visit: Payer: Self-pay | Admitting: Internal Medicine

## 2015-04-25 ENCOUNTER — Ambulatory Visit (INDEPENDENT_AMBULATORY_CARE_PROVIDER_SITE_OTHER): Payer: BC Managed Care – PPO | Admitting: Psychology

## 2015-04-25 DIAGNOSIS — F411 Generalized anxiety disorder: Secondary | ICD-10-CM | POA: Diagnosis not present

## 2015-04-27 DIAGNOSIS — D239 Other benign neoplasm of skin, unspecified: Secondary | ICD-10-CM

## 2015-04-27 HISTORY — DX: Other benign neoplasm of skin, unspecified: D23.9

## 2015-05-01 ENCOUNTER — Encounter: Payer: Self-pay | Admitting: Internal Medicine

## 2015-05-22 ENCOUNTER — Encounter: Payer: BC Managed Care – PPO | Admitting: Internal Medicine

## 2015-05-24 ENCOUNTER — Other Ambulatory Visit: Payer: Self-pay | Admitting: Internal Medicine

## 2015-05-24 NOTE — Telephone Encounter (Signed)
Last filled on 03/28/15 for 30 tablets and no refills. Okay to refill?

## 2015-05-31 ENCOUNTER — Ambulatory Visit (INDEPENDENT_AMBULATORY_CARE_PROVIDER_SITE_OTHER): Payer: 59 | Admitting: Psychology

## 2015-05-31 DIAGNOSIS — F900 Attention-deficit hyperactivity disorder, predominantly inattentive type: Secondary | ICD-10-CM

## 2015-05-31 DIAGNOSIS — F4010 Social phobia, unspecified: Secondary | ICD-10-CM

## 2015-06-14 ENCOUNTER — Other Ambulatory Visit: Payer: Self-pay | Admitting: Internal Medicine

## 2015-06-14 NOTE — Telephone Encounter (Signed)
Last OV 5.9.16, last refill 6.5.16.  Please advise refill

## 2015-06-30 ENCOUNTER — Encounter: Payer: Self-pay | Admitting: Internal Medicine

## 2015-06-30 ENCOUNTER — Ambulatory Visit (INDEPENDENT_AMBULATORY_CARE_PROVIDER_SITE_OTHER): Payer: BC Managed Care – PPO | Admitting: Internal Medicine

## 2015-06-30 VITALS — BP 122/60 | HR 73 | Temp 98.0°F | Ht 69.5 in | Wt 223.0 lb

## 2015-06-30 DIAGNOSIS — Z0000 Encounter for general adult medical examination without abnormal findings: Secondary | ICD-10-CM | POA: Diagnosis not present

## 2015-06-30 NOTE — Patient Instructions (Signed)
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
## 2015-06-30 NOTE — Assessment & Plan Note (Signed)
General medical exam normal today. Encouraged healthy diet and exercise. Labs as ordered. Immunizations UTD except flu vaccine which he will get this fall.

## 2015-06-30 NOTE — Progress Notes (Signed)
Pre visit review using our clinic review tool, if applicable. No additional management support is needed unless otherwise documented below in the visit note. 

## 2015-07-03 ENCOUNTER — Other Ambulatory Visit: Payer: BC Managed Care – PPO

## 2015-07-06 LAB — COMPREHENSIVE METABOLIC PANEL
ALBUMIN: 4.5 g/dL (ref 3.5–5.5)
ALT: 23 IU/L (ref 0–44)
AST: 52 IU/L — ABNORMAL HIGH (ref 0–40)
Albumin/Globulin Ratio: 2 (ref 1.1–2.5)
Alkaline Phosphatase: 85 IU/L (ref 39–117)
BUN / CREAT RATIO: 11 (ref 9–20)
BUN: 9 mg/dL (ref 6–24)
Bilirubin Total: 0.3 mg/dL (ref 0.0–1.2)
CALCIUM: 9.4 mg/dL (ref 8.7–10.2)
CO2: 21 mmol/L (ref 18–29)
Chloride: 104 mmol/L (ref 97–108)
Creatinine, Ser: 0.8 mg/dL (ref 0.76–1.27)
GFR calc Af Amer: 121 mL/min/{1.73_m2} (ref >59)
GFR, EST NON AFRICAN AMERICAN: 105 mL/min/{1.73_m2} (ref >59)
GLOBULIN, TOTAL: 2.2 g/dL (ref 1.5–4.5)
Glucose: 97 mg/dL (ref 65–99)
Potassium: 4.1 mmol/L (ref 3.5–5.2)
SODIUM: 143 mmol/L (ref 134–144)
Total Protein: 6.7 g/dL (ref 6.0–8.5)

## 2015-07-06 LAB — CBC WITH DIFFERENTIAL/PLATELET
Basophils Absolute: 0 10*3/uL (ref 0.0–0.2)
Basos: 1 %
EOS (ABSOLUTE): 0.2 10*3/uL (ref 0.0–0.4)
Eos: 3 %
Hematocrit: 38.7 % (ref 37.5–51.0)
Hemoglobin: 13.6 g/dL (ref 12.6–17.7)
Immature Grans (Abs): 0 10*3/uL (ref 0.0–0.1)
Immature Granulocytes: 0 %
Lymphocytes Absolute: 2.2 10*3/uL (ref 0.7–3.1)
Lymphs: 35 %
MCH: 30 pg (ref 26.6–33.0)
MCHC: 35.1 g/dL (ref 31.5–35.7)
MCV: 85 fL (ref 79–97)
Monocytes Absolute: 0.5 10*3/uL (ref 0.1–0.9)
Monocytes: 8 %
Neutrophils Absolute: 3.3 10*3/uL (ref 1.4–7.0)
Neutrophils: 53 %
Platelets: 188 10*3/uL (ref 150–379)
RBC: 4.53 x10E6/uL (ref 4.14–5.80)
RDW: 13.3 % (ref 12.3–15.4)
WBC: 6.2 10*3/uL (ref 3.4–10.8)

## 2015-07-06 LAB — LIPID PANEL
CHOLESTEROL TOTAL: 161 mg/dL (ref 100–199)
Chol/HDL Ratio: 4.4 ratio units (ref 0.0–5.0)
HDL: 37 mg/dL — AB (ref >39)
LDL Calculated: 72 mg/dL (ref 0–99)
TRIGLYCERIDES: 261 mg/dL — AB (ref 0–149)
VLDL Cholesterol Cal: 52 mg/dL — ABNORMAL HIGH (ref 5–40)

## 2015-07-06 LAB — NICOTINE/COTININE METABOLITES
Cotinine: NOT DETECTED ng/mL
NICOTINE: NOT DETECTED ng/mL

## 2015-07-06 LAB — HEMOGLOBIN A1C
Est. average glucose Bld gHb Est-mCnc: 120 mg/dL
Hgb A1c MFr Bld: 5.8 % — ABNORMAL HIGH (ref 4.8–5.6)

## 2015-07-11 ENCOUNTER — Encounter: Payer: Self-pay | Admitting: *Deleted

## 2015-07-19 ENCOUNTER — Telehealth: Payer: Self-pay | Admitting: Internal Medicine

## 2015-07-19 NOTE — Telephone Encounter (Signed)
Needs follow up to ADHD testing. 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

## 2015-07-24 ENCOUNTER — Encounter: Payer: Self-pay | Admitting: Internal Medicine

## 2015-07-24 ENCOUNTER — Ambulatory Visit (INDEPENDENT_AMBULATORY_CARE_PROVIDER_SITE_OTHER): Payer: BC Managed Care – PPO | Admitting: Internal Medicine

## 2015-07-24 VITALS — BP 115/78 | HR 65 | Temp 97.9°F | Ht 69.5 in | Wt 219.4 lb

## 2015-07-24 DIAGNOSIS — R41840 Attention and concentration deficit: Secondary | ICD-10-CM

## 2015-07-24 DIAGNOSIS — Z23 Encounter for immunization: Secondary | ICD-10-CM | POA: Diagnosis not present

## 2015-07-24 DIAGNOSIS — F411 Generalized anxiety disorder: Secondary | ICD-10-CM

## 2015-07-24 MED ORDER — LISDEXAMFETAMINE DIMESYLATE 40 MG PO CAPS: 40.0000 mg | ORAL_CAPSULE | Freq: Every day | ORAL | Status: DC

## 2015-07-24 MED ORDER — LISDEXAMFETAMINE DIMESYLATE 20 MG PO CAPS: 20.0000 mg | ORAL_CAPSULE | Freq: Every day | ORAL | Status: DC

## 2015-07-24 NOTE — Patient Instructions (Signed)
Start Vyvanse 20mg  daily. Take this medication in the morning.  Email with update end of this week.

## 2015-07-24 NOTE — Assessment & Plan Note (Signed)
Discussed anxiety, particularly in social situations. We discussed that treating ADD may or may not help with this. Will start Vyvanse as noted, then plan for follow up in 3 weeks.

## 2015-07-24 NOTE — Assessment & Plan Note (Signed)
Recent testing shows concentration deficit and anxiety. Discussed potential treatments for both. Will start Vyvanse 20mg  daily and titrate dose up as needed. Discussed potential risks of stimulant medications. Follow up by email later this week and in a visit in 3 weeks.
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
## 2015-07-24 NOTE — Progress Notes (Signed)
Pre visit review using our clinic review tool, if applicable. No additional management support is needed unless otherwise documented below in the visit note. 

## 2015-07-24 NOTE — Addendum Note (Signed)
Addended by: Ronette Deter A on: 07/24/2015 03:56 PM   Modules accepted: Orders

## 2015-07-31 ENCOUNTER — Encounter: Payer: Self-pay | Admitting: Internal Medicine

## 2015-08-21 ENCOUNTER — Ambulatory Visit (INDEPENDENT_AMBULATORY_CARE_PROVIDER_SITE_OTHER): Payer: BC Managed Care – PPO | Admitting: Internal Medicine

## 2015-08-21 ENCOUNTER — Encounter: Payer: Self-pay | Admitting: Internal Medicine

## 2015-08-21 VITALS — BP 126/82 | HR 75 | Temp 97.5°F | Ht 70.0 in | Wt 215.5 lb

## 2015-08-21 DIAGNOSIS — R41840 Attention and concentration deficit: Secondary | ICD-10-CM | POA: Diagnosis not present

## 2015-08-21 MED ORDER — TRAMADOL HCL 50 MG PO TABS: ORAL_TABLET | ORAL | Status: DC

## 2015-08-21 MED ORDER — LISDEXAMFETAMINE DIMESYLATE 40 MG PO CAPS: 40.0000 mg | ORAL_CAPSULE | Freq: Every day | ORAL | Status: DC

## 2015-08-21 NOTE — Patient Instructions (Signed)
Continue current medications. 

## 2015-08-21 NOTE — Progress Notes (Signed)
Pre visit review using our clinic review tool, if applicable. No additional management support is needed unless otherwise documented below in the visit note. 

## 2015-08-21 NOTE — Assessment & Plan Note (Signed)
Symptoms markedly improved with addition of Vyvanse. Will continue current dose. Follow up in 3 months and prn.
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
## 2015-09-22 ENCOUNTER — Other Ambulatory Visit: Payer: Self-pay | Admitting: Internal Medicine

## 2015-09-22 ENCOUNTER — Other Ambulatory Visit: Payer: Self-pay | Admitting: *Deleted

## 2015-09-22 MED ORDER — TRAMADOL HCL 50 MG PO TABS: ORAL_TABLET | ORAL | Status: DC

## 2015-09-22 NOTE — Telephone Encounter (Signed)
Disgarded previous Rx from today

## 2015-12-05 ENCOUNTER — Encounter: Payer: Self-pay | Admitting: Internal Medicine

## 2015-12-05 ENCOUNTER — Ambulatory Visit (INDEPENDENT_AMBULATORY_CARE_PROVIDER_SITE_OTHER): Payer: BC Managed Care – PPO | Admitting: Internal Medicine

## 2015-12-05 VITALS — BP 137/80 | HR 68 | Temp 97.5°F | Ht 70.0 in | Wt 210.2 lb

## 2015-12-05 DIAGNOSIS — E669 Obesity, unspecified: Secondary | ICD-10-CM

## 2015-12-05 DIAGNOSIS — N529 Male erectile dysfunction, unspecified: Secondary | ICD-10-CM

## 2015-12-05 DIAGNOSIS — R41840 Attention and concentration deficit: Secondary | ICD-10-CM | POA: Diagnosis not present

## 2015-12-05 MED ORDER — LISDEXAMFETAMINE DIMESYLATE 40 MG PO CAPS: 40.0000 mg | ORAL_CAPSULE | Freq: Every day | ORAL | Status: DC

## 2015-12-05 MED ORDER — TADALAFIL 10 MG PO TABS: 10.0000 mg | ORAL_TABLET | Freq: Every day | ORAL | Status: DC | PRN

## 2015-12-05 NOTE — Assessment & Plan Note (Signed)
Symptoms well controlled with Vyvanse. Will continue.

## 2015-12-05 NOTE — Assessment & Plan Note (Signed)
Wt Readings from Last 3 Encounters:  12/05/15 210 lb 4 oz (95.369 kg)  08/21/15 215 lb 8 oz (97.75 kg)  07/24/15 219 lb 6 oz (99.508 kg)   Body mass index is 30.17 kg/(m^2). Congratulated pt on weight loss. Encouraged continued healthy diet and exercise.

## 2015-12-05 NOTE — Assessment & Plan Note (Signed)
Discussed potential side effects of medications such as Viagra. Will try change to Cialis. Follow up 3 months and prn.

## 2015-12-05 NOTE — Progress Notes (Signed)
Pre visit review using our clinic review tool, if applicable. No additional management support is needed unless otherwise documented below in the visit note. 

## 2015-12-05 NOTE — Patient Instructions (Signed)
Continue Vyvanse.  Start Cialis 10mg  as needed. You may increase this to 20mg  if needed.
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
## 2015-12-11 ENCOUNTER — Other Ambulatory Visit: Payer: Self-pay | Admitting: Internal Medicine

## 2016-01-08 ENCOUNTER — Telehealth: Payer: Self-pay

## 2016-01-08 NOTE — Telephone Encounter (Signed)
PA was denied for Vyvanse.  Please advise?

## 2016-01-08 NOTE — Telephone Encounter (Signed)
Will have to let pt know. Did he change insurance? He was stable on this medication last year.

## 2016-01-08 NOTE — Telephone Encounter (Signed)
Submitted to 2nd insurance.  Awaiting response. Thanks

## 2016-01-08 NOTE — Telephone Encounter (Signed)
Pt has coverage through 2 insurance companies, can you please try to get it authorized through the other insurance company.

## 2016-01-08 NOTE — Telephone Encounter (Signed)
PA started for Vyvanse on Cover my meds.

## 2016-01-09 NOTE — Telephone Encounter (Signed)
Patient not found with second insurance which was the [employer] and [employer]. I can try to do an appeal with the [employer].

## 2016-01-10 NOTE — Telephone Encounter (Signed)
[employer] insurance card requires [facility] use [facility], originally was using [facility].  [facility] to see if [facility] will be able to fill, if not, [facility] will send [facility] to [place].    Please complete the appeal or try to use [facility] request.

## 2016-01-10 NOTE — Telephone Encounter (Signed)
I received the [facility] from [facility] this morning and I am submitting it.

## 2016-01-10 NOTE — Telephone Encounter (Signed)
[facility] sent for [facility] for the Vyvanse, awaiting response

## 2016-01-11 NOTE — Telephone Encounter (Signed)
Notified pt. 

## 2016-01-11 NOTE — Telephone Encounter (Signed)
OK. He will have to talk with insurance about what meds are covered then make a follow up

## 2016-01-11 NOTE — Telephone Encounter (Signed)
I received a denial for vyvanse with second insurance also.  Please advise?

## 2016-01-11 NOTE — Telephone Encounter (Signed)
Pt states that he spoke with the insurance company, [employer] states that the PA was completed incorrectly. Pt does have ADD which was not indicated on PA.   Per Dr Gilford Rile please resubmit PA adding ADD as the reason for the medication.

## 2016-01-11 NOTE — Telephone Encounter (Signed)
Resubmitted with new changes on cover my meds.

## 2016-01-12 NOTE — Telephone Encounter (Signed)
Approved from 01/11/2016 to 01/11/2019, yeah!

## 2016-02-19 ENCOUNTER — Other Ambulatory Visit: Payer: Self-pay | Admitting: *Deleted

## 2016-02-19 MED ORDER — TRAMADOL HCL 50 MG PO TABS: ORAL_TABLET | ORAL | Status: DC

## 2016-02-19 NOTE — Telephone Encounter (Signed)
Last OV 12/05/15... Last refill 09/22/15, #30 with 2 refills... Okay to refill?

## 2016-04-06 ENCOUNTER — Other Ambulatory Visit: Payer: Self-pay | Admitting: Internal Medicine

## 2016-04-15 ENCOUNTER — Ambulatory Visit (INDEPENDENT_AMBULATORY_CARE_PROVIDER_SITE_OTHER): Payer: BC Managed Care – PPO | Admitting: Internal Medicine

## 2016-04-15 ENCOUNTER — Ambulatory Visit: Payer: Self-pay | Admitting: Internal Medicine

## 2016-04-15 ENCOUNTER — Encounter: Payer: Self-pay | Admitting: Internal Medicine

## 2016-04-15 VITALS — BP 122/78 | HR 62 | Ht 69.0 in | Wt 205.4 lb

## 2016-04-15 DIAGNOSIS — Z1211 Encounter for screening for malignant neoplasm of colon: Secondary | ICD-10-CM | POA: Diagnosis not present

## 2016-04-15 DIAGNOSIS — R41840 Attention and concentration deficit: Secondary | ICD-10-CM

## 2016-04-15 DIAGNOSIS — F411 Generalized anxiety disorder: Secondary | ICD-10-CM

## 2016-04-15 MED ORDER — LISDEXAMFETAMINE DIMESYLATE 40 MG PO CAPS: 40.0000 mg | ORAL_CAPSULE | Freq: Every day | ORAL | Status: DC

## 2016-04-15 NOTE — Assessment & Plan Note (Signed)
Referral placed for colonoscopy. 
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
## 2016-04-15 NOTE — Assessment & Plan Note (Signed)
Continues to have some anxiety with public speaking. Discussed some potential medical interventions and support networks, including [employer]. Follow up 3 months and prn.

## 2016-04-15 NOTE — Assessment & Plan Note (Signed)
Symptoms improved. Doing well. Will continue Vyvanse.

## 2016-04-15 NOTE — Progress Notes (Signed)
Pre visit review using our clinic review tool, if applicable. No additional management support is needed unless otherwise documented below in the visit note. 

## 2016-05-15 ENCOUNTER — Encounter: Payer: Self-pay | Admitting: Internal Medicine

## 2016-06-14 ENCOUNTER — Ambulatory Visit: Payer: Self-pay | Admitting: Internal Medicine

## 2016-06-14 ENCOUNTER — Encounter: Payer: Self-pay | Admitting: Family Medicine

## 2016-06-14 ENCOUNTER — Ambulatory Visit (INDEPENDENT_AMBULATORY_CARE_PROVIDER_SITE_OTHER): Payer: BC Managed Care – PPO | Admitting: Family Medicine

## 2016-06-14 VITALS — BP 128/84 | HR 76 | Temp 98.1°F | Wt 205.0 lb

## 2016-06-14 DIAGNOSIS — E669 Obesity, unspecified: Secondary | ICD-10-CM | POA: Diagnosis not present

## 2016-06-14 DIAGNOSIS — R41840 Attention and concentration deficit: Secondary | ICD-10-CM | POA: Diagnosis not present

## 2016-06-14 DIAGNOSIS — Z1211 Encounter for screening for malignant neoplasm of colon: Secondary | ICD-10-CM

## 2016-06-14 DIAGNOSIS — M542 Cervicalgia: Secondary | ICD-10-CM

## 2016-06-14 DIAGNOSIS — G8929 Other chronic pain: Secondary | ICD-10-CM

## 2016-06-14 LAB — COMPREHENSIVE METABOLIC PANEL
ALBUMIN: 4.4 g/dL (ref 3.5–5.2)
ALT: 15 U/L (ref 0–53)
AST: 19 U/L (ref 0–37)
Alkaline Phosphatase: 71 U/L (ref 39–117)
BUN: 8 mg/dL (ref 6–23)
CHLORIDE: 105 meq/L (ref 96–112)
CO2: 29 mEq/L (ref 19–32)
Calcium: 9.5 mg/dL (ref 8.4–10.5)
Creatinine, Ser: 0.86 mg/dL (ref 0.40–1.50)
GFR: 99.98 mL/min (ref >60.00)
Glucose, Bld: 91 mg/dL (ref 70–99)
POTASSIUM: 4.1 meq/L (ref 3.5–5.1)
SODIUM: 141 meq/L (ref 135–145)
Total Bilirubin: 0.7 mg/dL (ref 0.2–1.2)
Total Protein: 7 g/dL (ref 6.0–8.3)

## 2016-06-14 LAB — LIPID PANEL
Cholesterol: 141 mg/dL (ref 0–200)
HDL: 49.9 mg/dL (ref >39.00)
LDL Cholesterol: 79 mg/dL (ref 0–99)
NONHDL: 91.19
TRIGLYCERIDES: 63 mg/dL (ref 0.0–149.0)
Total CHOL/HDL Ratio: 3
VLDL: 12.6 mg/dL (ref 0.0–40.0)

## 2016-06-14 LAB — HEMOGLOBIN A1C: HEMOGLOBIN A1C: 5.2 % (ref 4.6–6.5)

## 2016-06-14 MED ORDER — LISDEXAMFETAMINE DIMESYLATE 40 MG PO CAPS: 40.0000 mg | ORAL_CAPSULE | Freq: Every day | ORAL | 0 refills | Status: DC

## 2016-06-14 MED ORDER — LISDEXAMFETAMINE DIMESYLATE 40 MG PO CAPS: 40.0000 mg | ORAL_CAPSULE | ORAL | 0 refills | Status: DC

## 2016-06-14 MED ORDER — MELOXICAM 7.5 MG PO TABS: 7.5000 mg | ORAL_TABLET | Freq: Two times a day (BID) | ORAL | 3 refills | Status: DC

## 2016-06-14 MED ORDER — TRAMADOL HCL 50 MG PO TABS: ORAL_TABLET | ORAL | 2 refills | Status: DC

## 2016-06-14 NOTE — Assessment & Plan Note (Signed)
Weight has been trending down. Encouraged to continue dietary changes. Stay as active as possible. Lab work as outlined below.
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
## 2016-06-14 NOTE — Progress Notes (Signed)
Pre visit review using our clinic review tool, if applicable. No additional management support is needed unless otherwise documented below in the visit note. 

## 2016-06-14 NOTE — Assessment & Plan Note (Signed)
Table on Vyvanse. We will refill this for 3 months.

## 2016-06-14 NOTE — Assessment & Plan Note (Signed)
Has been well-managed on meloxicam and tramadol. Is avoiding exercise due to this issue. He'll continue to monitor. Discussed that if tingling worsens in his hands or if he develops numbness or weakness we'll need to evaluate further. Refill of meloxicam and tramadol provided.

## 2016-06-14 NOTE — Patient Instructions (Signed)
Nice to see you. We will refill your Vyvanse. I refilled your meloxicam and tramadol as well. We will obtain lab work today. We will also get you scheduled for a colonoscopy.

## 2016-06-17 ENCOUNTER — Encounter: Payer: Self-pay | Admitting: Family Medicine

## 2016-06-19 ENCOUNTER — Telehealth: Payer: Self-pay | Admitting: Family Medicine

## 2016-06-19 NOTE — Telephone Encounter (Signed)
Pt called returning your call regarding lab results. Thank you!

## 2016-06-25 ENCOUNTER — Encounter: Payer: Self-pay | Admitting: Family Medicine

## 2016-06-25 ENCOUNTER — Encounter: Payer: Self-pay | Admitting: Internal Medicine

## 2016-08-06 ENCOUNTER — Telehealth: Payer: Self-pay | Admitting: Family Medicine

## 2016-08-06 NOTE — Telephone Encounter (Signed)
Pt dropped off health Screening form to be filled out.. Placed in Dr. Berna Bue folder up front.. Please advise

## 2016-08-07 NOTE — Telephone Encounter (Signed)
Form placed in red folder for Dr. Caryl Bis to fill out.

## 2016-08-12 NOTE — Telephone Encounter (Signed)
Can you confirm what they are trying to dispute? Is it his weight? Thanks.

## 2016-08-13 NOTE — Telephone Encounter (Signed)
The company is wanting something stating that you have discussed his weight and wight management.

## 2016-08-13 NOTE — Telephone Encounter (Signed)
Left message for patient to return call.

## 2016-08-14 ENCOUNTER — Telehealth: Payer: Self-pay | Admitting: *Deleted

## 2016-08-14 ENCOUNTER — Ambulatory Visit (AMBULATORY_SURGERY_CENTER): Payer: Self-pay | Admitting: *Deleted

## 2016-08-14 VITALS — Ht 69.0 in | Wt 202.0 lb

## 2016-08-14 DIAGNOSIS — Z1211 Encounter for screening for malignant neoplasm of colon: Secondary | ICD-10-CM

## 2016-08-14 MED ORDER — NA SULFATE-K SULFATE-MG SULF 17.5-3.13-1.6 GM/177ML PO SOLN: ORAL | 0 refills | Status: DC

## 2016-08-14 NOTE — Telephone Encounter (Signed)
Noted  

## 2016-08-14 NOTE — Telephone Encounter (Signed)
Completed and given to [facility].

## 2016-08-14 NOTE — Telephone Encounter (Signed)
Patient's last colonoscopy was 2006 at [facility] with Justin. Patient states it was "normal". Release of information form signed and given to [place].

## 2016-08-28 ENCOUNTER — Ambulatory Visit (AMBULATORY_SURGERY_CENTER): Payer: BC Managed Care – PPO | Admitting: Internal Medicine

## 2016-08-28 ENCOUNTER — Encounter: Payer: Self-pay | Admitting: Internal Medicine

## 2016-08-28 VITALS — BP 128/87 | HR 77 | Temp 97.7°F | Resp 16 | Ht 69.0 in | Wt 202.0 lb

## 2016-08-28 DIAGNOSIS — D123 Benign neoplasm of transverse colon: Secondary | ICD-10-CM

## 2016-08-28 DIAGNOSIS — Z1212 Encounter for screening for malignant neoplasm of rectum: Secondary | ICD-10-CM

## 2016-08-28 DIAGNOSIS — Z1211 Encounter for screening for malignant neoplasm of colon: Secondary | ICD-10-CM | POA: Diagnosis present

## 2016-08-28 MED ORDER — SODIUM CHLORIDE 0.9 % IV SOLN: 500.0000 mL | INTRAVENOUS | Status: DC

## 2016-08-28 NOTE — Progress Notes (Signed)
Called to room to assist during endoscopic procedure.  Patient ID and intended procedure confirmed with present staff. Received instructions for my participation in the procedure from the performing physician.  

## 2016-08-28 NOTE — Op Note (Signed)
[facility] Patient Name: Austin [name] Procedure Date: 08/28/2016 9:23 AM MRN: [id number] Endoscopist: Jerene Bears , MD Age: 50 Referring MD:  Date of Birth: 17-Feb-1966 Gender: Male Account #: [id number] Procedure:                Colonoscopy Indications:              Screening for colorectal malignant neoplasm Medicines:                Monitored Anesthesia Care Procedure:                Pre-Anesthesia Assessment:                           - Prior to the procedure, a History and Physical                            was performed, and patient medications and                            allergies were reviewed. The patient's tolerance of                            previous anesthesia was also reviewed. The risks                            and benefits of the procedure and the sedation                            options and risks were discussed with the patient.                            All questions were answered, and informed consent                            was obtained. Prior Anticoagulants: The patient has                            taken no previous anticoagulant or antiplatelet                            agents. ASA Grade Assessment: II - A patient with                            mild systemic disease. After reviewing the risks                            and benefits, the patient was deemed in                            satisfactory condition to undergo the procedure.                           After obtaining informed consent, the colonoscope  was passed under direct vision. Throughout the                            procedure, the patient's blood pressure, pulse, and                            oxygen saturations were monitored continuously. The                            Model CF-HQ190L [id number]) scope was introduced                            through the anus and advanced to the the cecum,                            identified by appendiceal  orifice and ileocecal                            valve. The colonoscopy was performed without                            difficulty. The patient tolerated the procedure                            well. The quality of the bowel preparation was                            good. The ileocecal valve, appendiceal orifice, and                            rectum were photographed. Scope In: 9:29:06 AM Scope Out: 9:38:53 AM Scope Withdrawal Time: 0 hours 8 minutes 4 seconds  Total Procedure Duration: 0 hours 9 minutes 47 seconds  Findings:                 The perianal exam findings include skin tags.                           A 5 mm polyp was found in the transverse colon. The                            polyp was sessile. The polyp was removed with a                            cold snare. Resection and retrieval were complete.                           Multiple medium-mouthed diverticula were found in                            the sigmoid colon and distal descending colon.                           The exam was otherwise without abnormality on  direct and retroflexion views. Complications:            No immediate complications. Estimated Blood Loss:     Estimated blood loss was minimal. Impression:               - One 5 mm polyp in the transverse colon, removed                            with a cold snare. Resected and retrieved.                           - Moderate diverticulosis in the sigmoid colon and                            in the distal descending colon.                           - The examination was otherwise normal on direct                            and retroflexion views. Recommendation:           - Patient has a contact number available for                            emergencies. The signs and symptoms of potential                            delayed complications were discussed with the                            patient. Return to normal activities  tomorrow.                            Written discharge instructions were provided to the                            patient.                           - Resume previous diet.                           - Continue present medications.                           - Await pathology results.                           - Repeat colonoscopy is recommended. The                            colonoscopy date will be determined after pathology                            results from today's exam become available for  review. Jerene Bears, MD 08/28/2016 9:41:30 AM This report has been signed electronically.

## 2016-08-28 NOTE — Progress Notes (Signed)
Report to PACU, RN, vss, BBS= Clear.  

## 2016-08-28 NOTE — Patient Instructions (Signed)
YOU HAD AN ENDOSCOPIC PROCEDURE TODAY AT THE [facility]:   Refer to the procedure report that was given to you for any specific questions about what was found during the examination.  If the procedure report does not answer your questions, please call your gastroenterologist to clarify.  If you requested that your care partner not be given the details of your procedure findings, then the procedure report has been included in a sealed envelope for you to review at your convenience later.  YOU SHOULD EXPECT: Some feelings of bloating in the abdomen. Passage of more gas than usual.  Walking can help get rid of the air that was put into your GI tract during the procedure and reduce the bloating. If you had a lower endoscopy (such as a colonoscopy or flexible sigmoidoscopy) you may notice spotting of blood in your stool or on the toilet paper. If you underwent a bowel prep for your procedure, you may not have a normal bowel movement for a few days.  Please Note:  You might notice some irritation and congestion in your nose or some drainage.  This is from the oxygen used during your procedure.  There is no need for concern and it should clear up in a day or so.  SYMPTOMS TO REPORT IMMEDIATELY:   Following lower endoscopy (colonoscopy or flexible sigmoidoscopy):  Excessive amounts of blood in the stool  Significant tenderness or worsening of abdominal pains  Swelling of the abdomen that is new, acute  Fever of 100F or higher  For urgent or emergent issues, a gastroenterologist can be reached at any hour by calling [phone].   DIET:  We do recommend a small meal at first, but then you may proceed to your regular diet.  Drink plenty of fluids but you should avoid alcoholic beverages for 24 hours.  ACTIVITY:  You should plan to take it easy for the rest of today and you should NOT DRIVE or use heavy machinery until tomorrow (because of the sedation medicines used during the test).     FOLLOW UP: Our staff will call the number listed on your records the next business day following your procedure to check on you and address any questions or concerns that you may have regarding the information given to you following your procedure. If we do not reach you, we will leave a message.  However, if you are feeling well and you are not experiencing any problems, there is no need to return our call.  We will assume that you have returned to your regular daily activities without incident.  If any biopsies were taken you will be contacted by phone or by letter within the next 1-3 weeks.  Please call us at [phone] if you have not heard about the biopsies in 3 weeks.    SIGNATURES/CONFIDENTIALITY: You and/or your care partner have signed paperwork which will be entered into your electronic medical record.  These signatures attest to the fact that that the information above on your After Visit Summary has been reviewed and is understood.  Full responsibility of the confidentiality of this discharge information lies with you and/or your care-partner. 

## 2016-08-29 ENCOUNTER — Telehealth: Payer: Self-pay

## 2016-08-29 NOTE — Telephone Encounter (Signed)
  Follow up Call-  Call back number 08/28/2016  Post procedure Call Back phone  # [phone]  Permission to leave phone message Yes  Some recent data might be hidden     Patient questions:  Do you have a fever, pain , or abdominal swelling? No. Pain Score  0 *  Have you tolerated food without any problems? Yes.    Have you been able to return to your normal activities? Yes.    Do you have any questions about your discharge instructions: Diet   No. Medications  No. Follow up visit  No.  Do you have questions or concerns about your Care? No.  Actions: * If pain score is 4 or above: No action needed, pain <4.

## 2016-08-29 NOTE — Telephone Encounter (Signed)
Left a message for the pt to call us if any questions or concerns. maw

## 2016-09-02 ENCOUNTER — Encounter: Payer: Self-pay | Admitting: Internal Medicine

## 2016-10-01 ENCOUNTER — Encounter: Payer: Self-pay | Admitting: Family Medicine

## 2016-10-01 ENCOUNTER — Ambulatory Visit (INDEPENDENT_AMBULATORY_CARE_PROVIDER_SITE_OTHER): Payer: BC Managed Care – PPO | Admitting: Family Medicine

## 2016-10-01 ENCOUNTER — Ambulatory Visit (INDEPENDENT_AMBULATORY_CARE_PROVIDER_SITE_OTHER): Payer: BC Managed Care – PPO

## 2016-10-01 VITALS — BP 124/86 | HR 66 | Temp 98.5°F | Wt 204.6 lb

## 2016-10-01 DIAGNOSIS — F411 Generalized anxiety disorder: Secondary | ICD-10-CM

## 2016-10-01 DIAGNOSIS — R41840 Attention and concentration deficit: Secondary | ICD-10-CM

## 2016-10-01 DIAGNOSIS — Z23 Encounter for immunization: Secondary | ICD-10-CM

## 2016-10-01 DIAGNOSIS — M5441 Lumbago with sciatica, right side: Secondary | ICD-10-CM | POA: Diagnosis not present

## 2016-10-01 DIAGNOSIS — N529 Male erectile dysfunction, unspecified: Secondary | ICD-10-CM

## 2016-10-01 DIAGNOSIS — G8929 Other chronic pain: Secondary | ICD-10-CM | POA: Diagnosis not present

## 2016-10-01 DIAGNOSIS — M542 Cervicalgia: Secondary | ICD-10-CM

## 2016-10-01 MED ORDER — LISDEXAMFETAMINE DIMESYLATE 40 MG PO CAPS: 40.0000 mg | ORAL_CAPSULE | ORAL | 0 refills | Status: DC

## 2016-10-01 MED ORDER — TRAMADOL HCL 50 MG PO TABS: ORAL_TABLET | ORAL | 2 refills | Status: DC

## 2016-10-01 MED ORDER — PROPRANOLOL HCL 20 MG PO TABS: ORAL_TABLET | ORAL | 0 refills | Status: DC

## 2016-10-01 NOTE — Assessment & Plan Note (Signed)
Patient not interested in treatment at this time. He'll continue to monitor.

## 2016-10-01 NOTE — Assessment & Plan Note (Signed)
Overall doing well. Stable on Vyvanse. We'll refill this for 3 months.

## 2016-10-01 NOTE — Assessment & Plan Note (Signed)
Patient notes chronic neck and back pain. Has been having some paresthesias to his right foot and right ulnar aspect of his hand. We will refill his tramadol. Given his progressively worsening symptoms over the year we will obtain x-rays of his neck and low back. He has requested an x-ray of his thoracic spine as well and thus this will be done. Once these return we will determine the next step in management. He is given return precautions.
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
## 2016-10-01 NOTE — Assessment & Plan Note (Signed)
Situational in nature. Discussed trialing propranolol to see if this beneficial. Also discussed SSRI versus BuSpar though he declined these at this time. Propranolol will be prescribed. Discussed trying this first at home prior to going to any meetings. If he has side effects he will let us know.

## 2016-10-01 NOTE — Progress Notes (Signed)
Pre visit review using our clinic review tool, if applicable. No additional management support is needed unless otherwise documented below in the visit note. 

## 2016-10-01 NOTE — Patient Instructions (Signed)
Nice to see you. We will refill your Vyvanse. We will try propranolol for your situational anxiety. You should take this 30-60 minutes prior to an anxiety provoking situation. You can only take this once daily. Please try this prior to using it for an anxiety provoking situation. If you develop any lightheadedness or side effects from this please let us know. We will obtain x-rays and call you with the results. If you develop numbness, weakness, loss of bowel or bladder function, numbness between her legs, or any new or changing symptoms please seek medical attention medially.

## 2016-10-07 ENCOUNTER — Other Ambulatory Visit: Payer: Self-pay | Admitting: Family Medicine

## 2016-10-07 DIAGNOSIS — M545 Low back pain: Principal | ICD-10-CM

## 2016-10-07 DIAGNOSIS — G8929 Other chronic pain: Secondary | ICD-10-CM

## 2016-11-09 ENCOUNTER — Other Ambulatory Visit: Payer: Self-pay | Admitting: Family Medicine

## 2016-12-17 ENCOUNTER — Encounter: Payer: Self-pay | Admitting: Family Medicine

## 2016-12-19 ENCOUNTER — Other Ambulatory Visit: Payer: Self-pay

## 2016-12-19 MED ORDER — MELOXICAM 7.5 MG PO TABS: 7.5000 mg | ORAL_TABLET | Freq: Two times a day (BID) | ORAL | 3 refills | Status: DC

## 2016-12-19 NOTE — Telephone Encounter (Signed)
Last OV 10/01/16 last filled 06/14/16 180 3rf

## 2017-01-02 ENCOUNTER — Encounter: Payer: Self-pay | Admitting: Family Medicine

## 2017-01-06 ENCOUNTER — Ambulatory Visit (INDEPENDENT_AMBULATORY_CARE_PROVIDER_SITE_OTHER): Payer: BC Managed Care – PPO | Admitting: Family Medicine

## 2017-01-06 ENCOUNTER — Encounter: Payer: Self-pay | Admitting: Family Medicine

## 2017-01-06 VITALS — BP 130/96 | HR 55 | Temp 97.9°F | Wt 205.8 lb

## 2017-01-06 DIAGNOSIS — E041 Nontoxic single thyroid nodule: Secondary | ICD-10-CM | POA: Diagnosis not present

## 2017-01-06 DIAGNOSIS — R41840 Attention and concentration deficit: Secondary | ICD-10-CM

## 2017-01-06 DIAGNOSIS — I10 Essential (primary) hypertension: Secondary | ICD-10-CM | POA: Insufficient documentation

## 2017-01-06 DIAGNOSIS — F411 Generalized anxiety disorder: Secondary | ICD-10-CM

## 2017-01-06 DIAGNOSIS — R030 Elevated blood-pressure reading, without diagnosis of hypertension: Secondary | ICD-10-CM | POA: Diagnosis not present

## 2017-01-06 MED ORDER — LISDEXAMFETAMINE DIMESYLATE 40 MG PO CAPS: 40.0000 mg | ORAL_CAPSULE | ORAL | 0 refills | Status: DC

## 2017-01-06 NOTE — Patient Instructions (Signed)
Nice to see you. We will refill your Vyvanse. I would like for you to start checking your blood pressure at home for the next week and return for a blood pressure check in the office. Please proceeded as you typically would with your Vyvanse while doing this. If your blood pressure is consistently over 140/90 please let us know.

## 2017-01-06 NOTE — Assessment & Plan Note (Signed)
Check TSH and thyroid ultrasound.

## 2017-01-06 NOTE — Assessment & Plan Note (Signed)
Improved on recheck though still slightly elevated. Discuss having him return in 1 week for recheck. Advised to proceed as he usually does with his Vyvanse. If elevated at that time may consider discontinuing Vyvanse.

## 2017-01-06 NOTE — Assessment & Plan Note (Addendum)
Has been well overall. Blood pressure is slightly elevated today though has been extremely well controlled previously. Please see elevated blood pressure for plan. We'll continue Vyvanse for now.

## 2017-01-06 NOTE — Progress Notes (Signed)
Tommi Rumps, MD Phone: [phone]  Austin [name] is a 51 y.o. male who presents today for follow-up.  ADD: Patient notes he is taking Vyvanse daily during the week. His weight has been stable. No appetite changes. No palpitations. He does note he initially gets a burst of energy after he takes it in the morning.  Anxiety: Patient had performance anxiety related to presentations. He notes the propranolol has been extremely helpful. It has made him significantly less anxious in these situations. He has not had any tiredness or fatigue with it.  Elevated blood pressure: Patient notes no history of hypertension. Does not check his blood pressure at home. No chest pain, shortness of breath, or edema.  Patient will additionally notes he had a scan of his neck to the orthopedic surgeon that revealed a possible thyroid nodule. We will obtain TSH today and schedule thyroid ultrasound  PMH: Former smoker   ROS see history of present illness  Objective  Physical Exam Vitals:   01/06/17 0759 01/06/17 0809  BP: (!) 160/110 (!) 130/96  Pulse: (!) 55   Temp: 97.9 F (36.6 C)     BP Readings from Last 3 Encounters:  01/06/17 (!) 130/96  10/01/16 124/86  08/28/16 128/87   Wt Readings from Last 3 Encounters:  01/06/17 205 lb 12.8 oz (93.4 kg)  10/01/16 204 lb 9.6 oz (92.8 kg)  08/28/16 202 lb (91.6 kg)    Physical Exam  Constitutional: No distress.  HENT:  Head: Normocephalic and atraumatic.  Cardiovascular: Normal rate, regular rhythm and normal heart sounds.   Pulmonary/Chest: Effort normal and breath sounds normal.  Musculoskeletal: He exhibits no edema.  Neurological: He is alert. Gait normal.  Skin: He is not diaphoretic.     Assessment/Plan: Please see individual problem list.  Anxiety state Doing well overall. Continue as needed propranolol.  Concentration deficit Has been well overall. Blood pressure is slightly elevated today though has been extremely well  controlled previously. Please see elevated blood pressure for plan. We'll continue Vyvanse for now.  Elevated blood pressure reading Improved on recheck though still slightly elevated. Discuss having him return in 1 week for recheck. Advised to proceed as he usually does with his Vyvanse. If elevated at that time may consider discontinuing Vyvanse.  Thyroid nodule Check TSH and thyroid ultrasound.   Orders Placed This Encounter  Procedures  . US THYROID    Standing Status:   Future    Standing Expiration Date:   03/06/2018    Order Specific Question:   Reason for Exam (SYMPTOM  OR DIAGNOSIS REQUIRED)    Answer:   thyroid nodule noted on prior scan    Order Specific Question:   Preferred imaging location?    Answer:   [facility]  . TSH    Meds ordered this encounter  Medications  . lisdexamfetamine (VYVANSE) 40 MG capsule    Sig: Take 1 capsule (40 mg total) by mouth every morning.    Dispense:  30 capsule    Refill:  0  . lisdexamfetamine (VYVANSE) 40 MG capsule    Sig: Take 1 capsule (40 mg total) by mouth every morning. Do not fill until 02/03/17    Dispense:  30 capsule    Refill:  0  . lisdexamfetamine (VYVANSE) 40 MG capsule    Sig: Take 1 capsule (40 mg total) by mouth every morning. Do not fill until 03/06/17    Dispense:  30 capsule    Refill:  0  Tommi Rumps, MD [place]

## 2017-01-06 NOTE — Progress Notes (Signed)
Pre visit review using our clinic review tool, if applicable. No additional management support is needed unless otherwise documented below in the visit note. 

## 2017-01-06 NOTE — Assessment & Plan Note (Signed)
Doing well overall. Continue as needed propranolol.

## 2017-01-07 LAB — TSH: TSH: 2.28 u[IU]/mL (ref 0.450–4.500)

## 2017-01-14 ENCOUNTER — Encounter: Payer: Self-pay | Admitting: Family Medicine

## 2017-01-16 ENCOUNTER — Other Ambulatory Visit: Payer: Self-pay | Admitting: Family Medicine

## 2017-01-16 ENCOUNTER — Ambulatory Visit (INDEPENDENT_AMBULATORY_CARE_PROVIDER_SITE_OTHER): Payer: BC Managed Care – PPO

## 2017-01-16 VITALS — BP 128/88 | HR 82 | Resp 16

## 2017-01-16 DIAGNOSIS — R030 Elevated blood-pressure reading, without diagnosis of hypertension: Secondary | ICD-10-CM

## 2017-01-16 MED ORDER — TRAMADOL HCL 50 MG PO TABS: ORAL_TABLET | ORAL | 2 refills | Status: DC

## 2017-01-16 NOTE — Progress Notes (Signed)
BP elevated. Needs to discuss with PCP. Can wait until tomorrow.

## 2017-01-16 NOTE — Progress Notes (Signed)
Patient comes in for blood pressure check.   He states blood pressure has been have been averaging at home 160/98 to 178/117.   I explained to patient that importance of contacting us in regards to elevated pressure or seeking treatment . He states he has been having headaches , when pressure is elevated.    Please advise.

## 2017-01-16 NOTE — Telephone Encounter (Signed)
Patient is scheduled for bp check today at 4pm, patient would like refill on tramadol, last filled 10/01/16 30 2rf

## 2017-01-17 NOTE — Progress Notes (Signed)
Blood pressure appears to be decently well-controlled in the office though seems to be elevated at home. I called and spoke with the patient regarding his blood pressures. He reports they've been elevated since being checked as well. The highest diastolic is gotten is 270. He reports last weekend when he was not on the Vyvanse he had no blood pressure issues. I discussed discontinuing or decreasing the dose of Vyvanse. We opted to decrease the dose. We will get him a new prescription on Monday. If it continues to be elevated on the decreased dose we will discontinue the Vyvanse. Tommi Rumps, M.D.

## 2017-01-20 ENCOUNTER — Encounter: Payer: Self-pay | Admitting: Family Medicine

## 2017-01-23 ENCOUNTER — Other Ambulatory Visit: Payer: Self-pay | Admitting: Family Medicine

## 2017-01-23 MED ORDER — LISDEXAMFETAMINE DIMESYLATE 20 MG PO CAPS: 20.0000 mg | ORAL_CAPSULE | ORAL | 0 refills | Status: DC

## 2017-01-23 MED ORDER — AMLODIPINE BESYLATE 5 MG PO TABS: 5.0000 mg | ORAL_TABLET | Freq: Every day | ORAL | 3 refills | Status: DC

## 2017-01-30 ENCOUNTER — Ambulatory Visit
Admission: RE | Admit: 2017-01-30 | Discharge: 2017-01-30 | Disposition: A | Discharge status: Home / Self Care | Payer: BC Managed Care – PPO | Source: Ambulatory Visit | Attending: Family Medicine | Admitting: Family Medicine

## 2017-01-30 DIAGNOSIS — E041 Nontoxic single thyroid nodule: Secondary | ICD-10-CM | POA: Diagnosis present

## 2017-01-30 DIAGNOSIS — E010 Iodine-deficiency related diffuse (endemic) goiter: Secondary | ICD-10-CM | POA: Diagnosis not present

## 2017-01-31 ENCOUNTER — Telehealth: Payer: Self-pay | Admitting: Family Medicine

## 2017-01-31 DIAGNOSIS — E041 Nontoxic single thyroid nodule: Secondary | ICD-10-CM

## 2017-01-31 NOTE — Telephone Encounter (Signed)
Called patient regarding thyroid ultrasound and nodule that was seen. They recommended biopsy. We'll refer to ENT to facilitate this. Patient also reports he has had some upper respiratory congestion starting Tuesday. Discussed monitoring this and if not improving letting us know.

## 2017-02-03 ENCOUNTER — Other Ambulatory Visit: Payer: Self-pay | Admitting: Family Medicine

## 2017-02-05 ENCOUNTER — Other Ambulatory Visit: Payer: Self-pay | Admitting: Otolaryngology

## 2017-02-05 DIAGNOSIS — E041 Nontoxic single thyroid nodule: Secondary | ICD-10-CM

## 2017-02-11 ENCOUNTER — Ambulatory Visit
Admission: RE | Admit: 2017-02-11 | Discharge: 2017-02-11 | Disposition: A | Discharge status: Home / Self Care | Payer: BC Managed Care – PPO | Source: Ambulatory Visit | Attending: Otolaryngology | Admitting: Otolaryngology

## 2017-02-11 DIAGNOSIS — E041 Nontoxic single thyroid nodule: Secondary | ICD-10-CM | POA: Insufficient documentation

## 2017-02-11 NOTE — Discharge Instructions (Signed)
Thyroid Biopsy The thyroid gland is a butterfly-shaped gland located in the front of the neck. It produces hormones that affect metabolism, growth and development, and body temperature. Thyroid biopsy is a procedure in which small samples of tissue or fluid are removed from the thyroid gland. The samples are then looked at under a microscope to check for abnormalities. This procedure is done to determine the cause of thyroid problems. It may be done to check for infection, cancer, or other thyroid problems. Two methods may be used for a thyroid biopsy. In one method, a thin needle is inserted through the skin and into the thyroid gland. In the other method, an open incision is made through the skin. Tell a health care provider about:  Any allergies you have.  All medicines you are taking, including vitamins, herbs, eye drops, creams, and over-the-counter medicines.  Any problems you or family members have had with anesthetic medicines.  Any blood disorders you have.  Any surgeries you have had.  Any medical conditions you have. What are the risks? Generally, this is a safe procedure. However, problems can occur and include:  Bleeding from the procedure site.  Infection.  Injury to structures near the thyroid gland. What happens before the procedure?  Ask your health care provider about:  Changing or stopping your regular medicines. This is especially important if you are taking diabetes medicines or blood thinners.  Taking medicines such as aspirin and ibuprofen. These medicines can thin your blood. Do not take these medicines before your procedure if your health care provider asks you not to.  Do not eat or drink anything after midnight on the night before the procedure or as directed by your health care provider.  You may have a blood sample taken. What happens during the procedure? Either of these methods may be used to perform a thyroid biopsy:  Fine needle biopsy. You may  be given medicine to help you relax (sedative). You will be asked to lie on your back with your head tipped backward to extend your neck. An area on your neck will be cleaned. A needle will then be inserted through the skin of your neck. You may be asked to avoid coughing, talking, swallowing, or making sounds during some portions of the procedure. The needle will be withdrawn once the tissue or fluid samples have been removed. Pressure may be applied to your neck to reduce swelling and ensure that bleeding has stopped. The samples will be sent to a lab for examination.  Open biopsy. You will be given medicine to make you sleep (general anesthetic). An incision will be made in your neck. A sample of thyroid tissue will be removed using surgical tools. The tissue sample will be sent for examination. In some cases, the sample may be examined during the biopsy. If that is done and cancer cells are found, some or all of the thyroid gland may be removed. The incision will be closed with stitches. What happens after the procedure?  Your recovery will be assessed and monitored.  You may have soreness and tenderness at the site of the biopsy. This should go away after a few days. Ice pack to puncture site 5 minutes every hour while awake for next 24 hours will reduse swelling and discomfort.  If you had an open biopsy, you may have a hoarse voice or sore throat for a couple days.  It is your responsibility to get your test results. This information is not intended to replace  advice given to you by your health care provider. Make sure you discuss any questions you have with your health care provider. Document Released: 08/25/2007 Document Revised: 06/30/2016 Document Reviewed: 01/20/2014 Elsevier Interactive Patient Education  2017 [employer].

## 2017-02-11 NOTE — Procedures (Signed)
Discussed procedure and risks with patient. Informed consent was obtained. Made several attempts under US guidance to perform FNA of left thyroid nodule, but could not access given depth and posterior location of nodule, as well as lesion moving from needle. Also, nodule appears to be cystic with septation, not solid. Procedure discontinued. Appropriate dressing applied. No immediate complication. Recommend continued ultrasound surveillance of nodule in 12 months.

## 2017-02-17 ENCOUNTER — Other Ambulatory Visit: Payer: Self-pay | Admitting: Otolaryngology

## 2017-02-17 DIAGNOSIS — E041 Nontoxic single thyroid nodule: Secondary | ICD-10-CM

## 2017-02-18 IMAGING — DX DG THORACIC SPINE 2V
2 series · 2 of 2 positions shown · non-contrast
Comparison: None.

CLINICAL DATA: Dorsalgia

EXAM:
THORACIC SPINE 2 VIEWS

[thoracic spine ap]
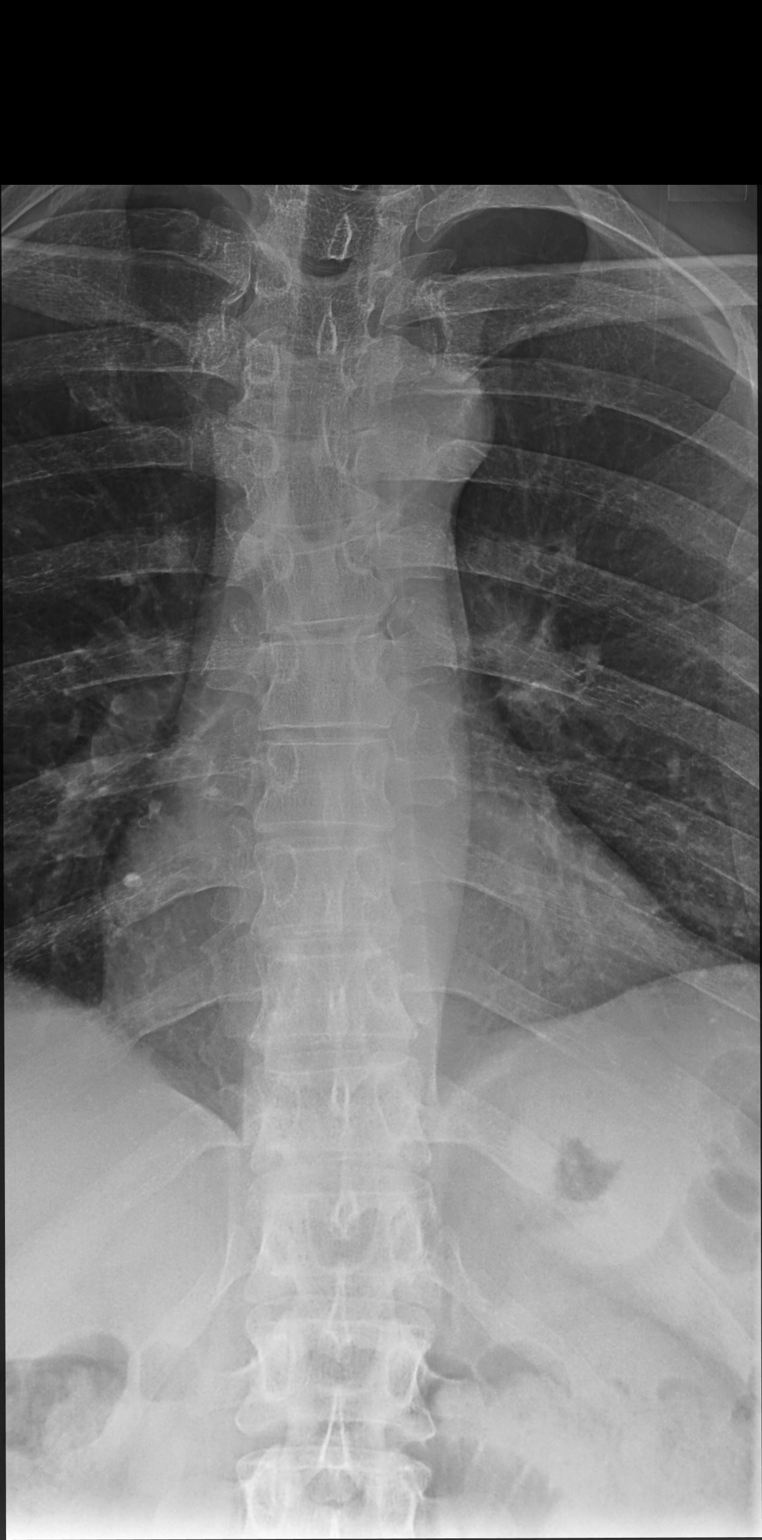

[thoracic spine lat]
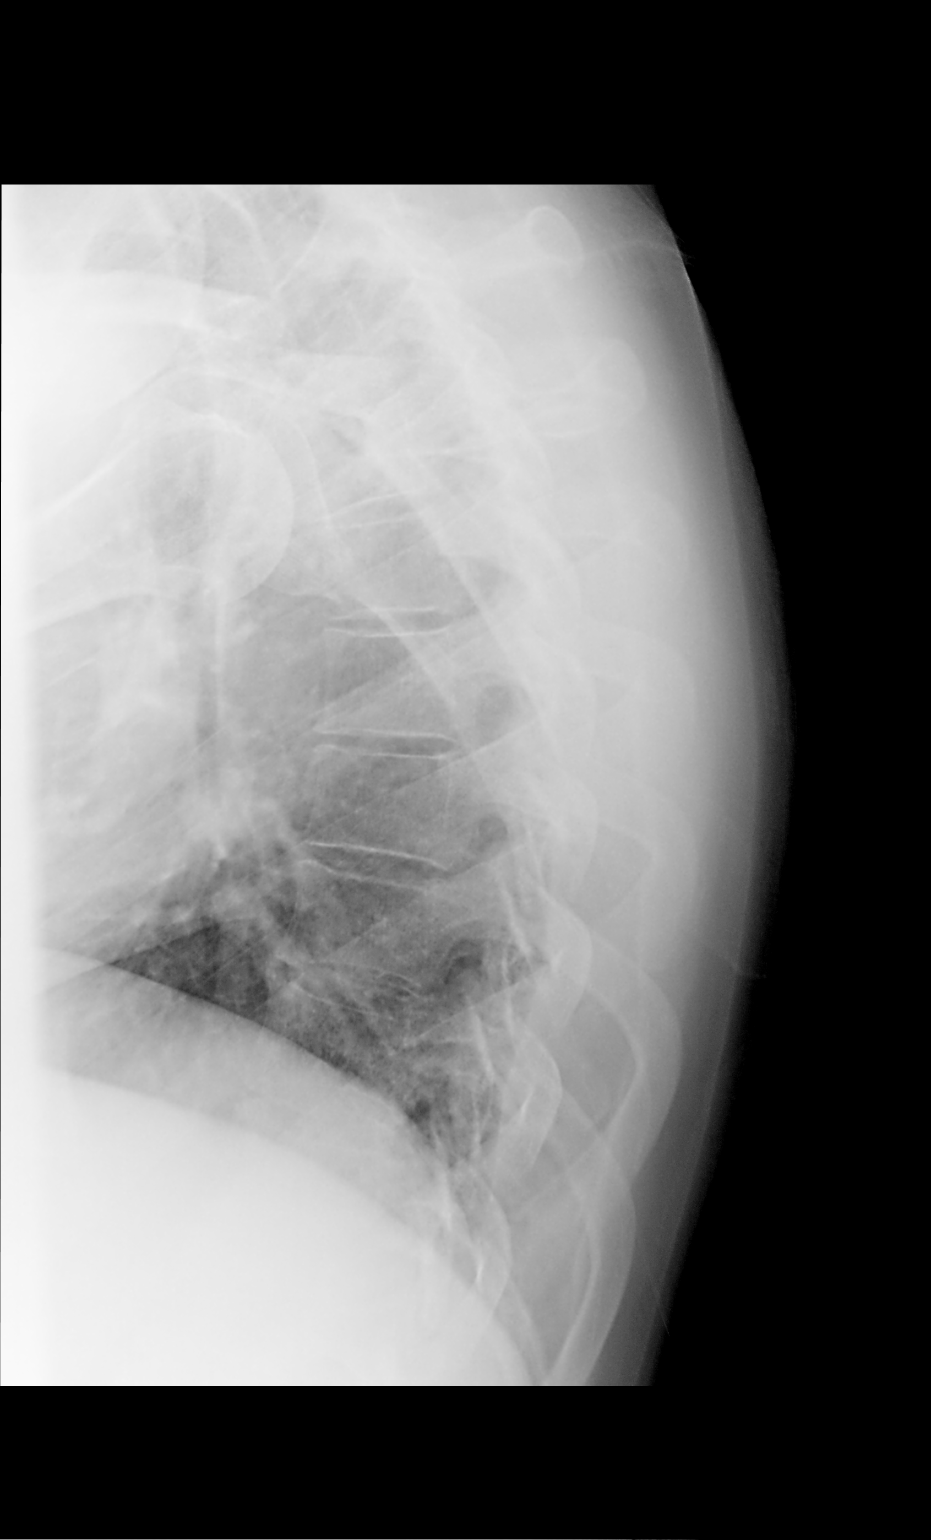

[2 of 2 positions shown; findings below may reference images not displayed]

FINDINGS: Frontal and lateral views were obtained. There is upper thoracic
dextroscoliosis. There is no fracture or spondylolisthesis. The disc
spaces appear unremarkable. No erosive change or paraspinous lesion.
IMPRESSION: Upper thoracic dextroscoliosis. No fracture or spondylolisthesis. No
apparent arthropathy.

## 2017-03-11 ENCOUNTER — Other Ambulatory Visit: Payer: Self-pay

## 2017-03-11 MED ORDER — PROPRANOLOL HCL 20 MG PO TABS: ORAL_TABLET | ORAL | 0 refills | Status: DC

## 2017-03-18 ENCOUNTER — Other Ambulatory Visit: Payer: Self-pay

## 2017-03-18 MED ORDER — OMEPRAZOLE 20 MG PO CPDR: 20.0000 mg | DELAYED_RELEASE_CAPSULE | Freq: Every day | ORAL | 0 refills | Status: DC

## 2017-04-21 ENCOUNTER — Encounter: Payer: Self-pay | Admitting: Family Medicine

## 2017-04-21 ENCOUNTER — Ambulatory Visit (INDEPENDENT_AMBULATORY_CARE_PROVIDER_SITE_OTHER): Payer: BC Managed Care – PPO | Admitting: Family Medicine

## 2017-04-21 DIAGNOSIS — I10 Essential (primary) hypertension: Secondary | ICD-10-CM

## 2017-04-21 DIAGNOSIS — R41840 Attention and concentration deficit: Secondary | ICD-10-CM

## 2017-04-21 DIAGNOSIS — F411 Generalized anxiety disorder: Secondary | ICD-10-CM | POA: Diagnosis not present

## 2017-04-21 DIAGNOSIS — K219 Gastro-esophageal reflux disease without esophagitis: Secondary | ICD-10-CM | POA: Diagnosis not present

## 2017-04-21 DIAGNOSIS — M542 Cervicalgia: Secondary | ICD-10-CM | POA: Diagnosis not present

## 2017-04-21 DIAGNOSIS — G8929 Other chronic pain: Secondary | ICD-10-CM

## 2017-04-21 MED ORDER — PROPRANOLOL HCL 20 MG PO TABS
ORAL_TABLET | ORAL | 0 refills | Status: DC
Start: 2017-04-21 — End: 2018-10-16

## 2017-04-21 MED ORDER — LISDEXAMFETAMINE DIMESYLATE 20 MG PO CAPS: 20.0000 mg | ORAL_CAPSULE | ORAL | 0 refills | Status: DC

## 2017-04-21 MED ORDER — TRAMADOL HCL 50 MG PO TABS: ORAL_TABLET | ORAL | 2 refills | Status: DC

## 2017-04-21 MED ORDER — SERTRALINE HCL 50 MG PO TABS: 50.0000 mg | ORAL_TABLET | Freq: Every day | ORAL | 3 refills | Status: DC

## 2017-04-21 NOTE — Assessment & Plan Note (Signed)
Stable.  Continue tramadol as needed

## 2017-04-21 NOTE — Assessment & Plan Note (Signed)
Well-controlled on Prilosec. He'll continue this.

## 2017-04-21 NOTE — Assessment & Plan Note (Signed)
Well-controlled at home. He will continue amlodipine and continue to monitor.

## 2017-04-21 NOTE — Patient Instructions (Signed)
Nice to see you. Please continue to monitor your blood pressure. If it is consistently greater than 130/80 please let us know. We'll start you on Zoloft for your anxiety. We'll see you back in about 6 weeks to see how this is doing.

## 2017-04-21 NOTE — Assessment & Plan Note (Signed)
Refill given on Vyvanse.

## 2017-04-21 NOTE — Progress Notes (Signed)
Austin Rumps, MD Phone: [phone]  Austin [name] is a 51 y.o. male who presents today for follow-up.  ADHD: Taking Vyvanse. This does work quite well for him. Typically only takes it during the week. No palpitations, sleep issues, or appetite changes.  Hypertension: Typically in the 120s over 70s at home. Occasionally up to 90 diastolically. Is taking amlodipine. No chest pain, shortness breath, or edema.  Anxiety: Was being treated for situational anxiety regarding meetings and presentations with propranolol. Notes this helped significantly. He has noticed since being on this though that he has noticed some anxiety in other settings. Anxiety is more generalized. He's taken propranolol before going out and notes it has been beneficial as well. No depression.  GERD: No issues when taking Prilosec. If he misses a dose he'll have regurgitation and burning. He has had an EGD past. No blood in stool or abdominal pain. Asymptomatic currently.  Chronic neck pain: Takes tramadol once nightly for this. Notes it is very beneficial. Helps control his pain. No drowsiness with it. He has been evaluated by orthopedics for this.  PMH: Former smoker   ROS see history of present illness  Objective  Physical Exam Vitals:   04/21/17 1308  BP: 130/90  Pulse: 60  Temp: 97.8 F (36.6 C)    BP Readings from Last 3 Encounters:  04/21/17 130/90  02/11/17 (!) 139/93  01/16/17 128/88   Wt Readings from Last 3 Encounters:  04/21/17 204 lb 6.4 oz (92.7 kg)  02/11/17 197 lb (89.4 kg)  01/06/17 205 lb 12.8 oz (93.4 kg)    Physical Exam  Constitutional: He is well-developed, well-nourished, and in no distress.  Cardiovascular: Normal rate, regular rhythm and normal heart sounds.   Pulmonary/Chest: Effort normal and breath sounds normal.  Musculoskeletal: He exhibits no edema.  Neurological: He is alert. Gait normal.  Skin: Skin is warm and dry.     Assessment/Plan: Please see individual  problem list.  GERD (gastroesophageal reflux disease) Well-controlled on Prilosec. He'll continue this.  Concentration deficit Refill given on Vyvanse.  Anxiety state He has noted some anxiety outside of the specific situations he was having it in previously. He is interested in something to help treat his anxiety throughout the day. We'll start on Zoloft. Discussed potential for serotonin syndrome with Zoloft and tramadol though this would be unlikely given his low dose of tramadol. He'll follow-up in 6 weeks.  Chronic neck pain Stable. Continue tramadol as needed.  Hypertension Well-controlled at home. He will continue amlodipine and continue to monitor.   No orders of the defined types were placed in this encounter.   Meds ordered this encounter  Medications  . lisdexamfetamine (VYVANSE) 20 MG capsule    Sig: Take 1 capsule (20 mg total) by mouth every morning.    Dispense:  30 capsule    Refill:  0  . lisdexamfetamine (VYVANSE) 20 MG capsule    Sig: Take 1 capsule (20 mg total) by mouth every morning. Do not fill until 05/21/17    Dispense:  30 capsule    Refill:  0  . lisdexamfetamine (VYVANSE) 20 MG capsule    Sig: Take 1 capsule (20 mg total) by mouth every morning. Do not fill until 06/21/17    Dispense:  30 capsule    Refill:  0  . sertraline (ZOLOFT) 50 MG tablet    Sig: Take 1 tablet (50 mg total) by mouth daily.    Dispense:  30 tablet    Refill:  3  . propranolol (INDERAL) 20 MG tablet    Sig: TAKE 1 TABLET BY MOUTH ONCE DAILY AS NEEDED 30-60 MINUTES PRIOR TO ANXIETY PROVOKING SITUATION    Dispense:  90 tablet    Refill:  0  . traMADol (ULTRAM) 50 MG tablet    Sig: TAKE ONE TABLET BY MOUTH ONCE DAILY AS NEEDED    Dispense:  30 tablet    Refill:  2    Austin Rumps, MD [place]

## 2017-04-21 NOTE — Assessment & Plan Note (Signed)
He has noted some anxiety outside of the specific situations he was having it in previously. He is interested in something to help treat his anxiety throughout the day. We'll start on Zoloft. Discussed potential for serotonin syndrome with Zoloft and tramadol though this would be unlikely given his low dose of tramadol. He'll follow-up in 6 weeks.

## 2017-06-06 ENCOUNTER — Other Ambulatory Visit: Payer: Self-pay | Admitting: Family Medicine

## 2017-06-18 ENCOUNTER — Encounter: Payer: Self-pay | Admitting: Family Medicine

## 2017-06-18 ENCOUNTER — Ambulatory Visit (INDEPENDENT_AMBULATORY_CARE_PROVIDER_SITE_OTHER): Payer: BC Managed Care – PPO | Admitting: Family Medicine

## 2017-06-18 VITALS — BP 138/80 | HR 56 | Temp 97.9°F | Resp 18 | Ht 69.0 in | Wt 198.4 lb

## 2017-06-18 DIAGNOSIS — R600 Localized edema: Secondary | ICD-10-CM | POA: Diagnosis not present

## 2017-06-18 DIAGNOSIS — F411 Generalized anxiety disorder: Secondary | ICD-10-CM

## 2017-06-18 DIAGNOSIS — I10 Essential (primary) hypertension: Secondary | ICD-10-CM | POA: Diagnosis not present

## 2017-06-18 DIAGNOSIS — R41840 Attention and concentration deficit: Secondary | ICD-10-CM | POA: Diagnosis not present

## 2017-06-18 MED ORDER — AMLODIPINE BESYLATE 10 MG PO TABS: 10.0000 mg | ORAL_TABLET | Freq: Every day | ORAL | 3 refills | Status: DC

## 2017-06-18 MED ORDER — LISDEXAMFETAMINE DIMESYLATE 20 MG PO CAPS: 20.0000 mg | ORAL_CAPSULE | ORAL | 0 refills | Status: DC

## 2017-06-18 NOTE — Assessment & Plan Note (Signed)
Improved with Zoloft. He'll continue this.

## 2017-06-18 NOTE — Progress Notes (Signed)
  Austin Rumps, MD Phone: [phone]  Austin [name] is a 51 y.o. male who presents today for f/u.  HYPERTENSION  Disease Monitoring  Home BP Monitoring 120-143/84-1 02, he's been checking it and there seems to be no rhyme or reason to when it is elevated. Occasionally it is not elevated on days where he does take Vyvanse though it can also be elevated on days he does not take the Vyvanse.Chest pain- no    Dyspnea- no Medications  Compliance-  taking amlodipine.  Edema- rare in his ankles if he stands up for long periods of time. No orthopnea.  ADHD: Taking Vyvanse has been beneficial. He has noted some difference with decreasing the dose from prior. He's not as anxious in the morning. No sleep issues. No palpitations. No appetite changes.  Anxiety: Notes this is quite a bit better with the Zoloft. He still takes propranolol to help with meetings. Notes really only having anxiety if there is something intense going on.   PMH: Former smoker   ROS see history of present illness  Objective  Physical Exam Vitals:   06/18/17 1008  BP: 138/80  Pulse: (!) 56  Resp: 18  Temp: 97.9 F (36.6 C)    BP Readings from Last 3 Encounters:  06/18/17 138/80  04/21/17 130/90  02/11/17 (!) 139/93   Wt Readings from Last 3 Encounters:  06/18/17 198 lb 6 oz (90 kg)  04/21/17 204 lb 6.4 oz (92.7 kg)  02/11/17 197 lb (89.4 kg)    Physical Exam  Constitutional: No distress.  Cardiovascular: Normal rate, regular rhythm and normal heart sounds.   Pulmonary/Chest: Effort normal and breath sounds normal.  Musculoskeletal: He exhibits no edema.  Neurological: He is alert. Gait normal.  Skin: Skin is warm and dry. He is not diaphoretic.     Assessment/Plan: Please see individual problem list.  Hypertension Still somewhat elevated at times though somewhat improved. Based on his blood pressure log it does not necessarily seem to be related to Vyvanse as his blood pressure can be elevated  on days where he does not take this. Will increase amlodipine. He'll monitor. He'll follow-up in one month for recheck with the nurse. Lab work as outlined below.  Concentration deficit At this point we'll continue Vyvanse. Refills given.  Anxiety state Improved with Zoloft. He'll continue this.   Orders Placed This Encounter  Procedures  . Comp Met (CMET)  . Lipid Profile  . HgB A1c  . CBC    Meds ordered this encounter  Medications  . amLODipine (NORVASC) 10 MG tablet    Sig: Take 1 tablet (10 mg total) by mouth daily.    Dispense:  90 tablet    Refill:  3  . lisdexamfetamine (VYVANSE) 20 MG capsule    Sig: Take 1 capsule (20 mg total) by mouth every morning.    Dispense:  30 capsule    Refill:  0  . lisdexamfetamine (VYVANSE) 20 MG capsule    Sig: Take 1 capsule (20 mg total) by mouth every morning. Do not fill until 07/19/17    Dispense:  30 capsule    Refill:  0  . lisdexamfetamine (VYVANSE) 20 MG capsule    Sig: Take 1 capsule (20 mg total) by mouth every morning. Do not fill until 08/18/17    Dispense:  30 capsule    Refill:  0   Austin Rumps, MD [place]

## 2017-06-18 NOTE — Assessment & Plan Note (Signed)
Still somewhat elevated at times though somewhat improved. Based on his blood pressure log it does not necessarily seem to be related to Vyvanse as his blood pressure can be elevated on days where he does not take this. Will increase amlodipine. He'll monitor. He'll follow-up in one month for recheck with the nurse. Lab work as outlined below.

## 2017-06-18 NOTE — Patient Instructions (Signed)
Nice to see you. Please increase your amlodipine to 10 mg daily. You may take two 5 mg per day until you run out of your current prescription. Please monitor your anxiety and blood pressure. If you're blood pressure is not consistently less than 130/80 with this change please let us know.

## 2017-06-18 NOTE — Assessment & Plan Note (Signed)
At this point we'll continue Vyvanse. Refills given.

## 2017-06-19 LAB — LIPID PANEL
CHOLESTEROL TOTAL: 158 mg/dL (ref 100–199)
Chol/HDL Ratio: 3.3 ratio (ref 0.0–5.0)
HDL: 48 mg/dL (ref >39)
LDL CALC: 93 mg/dL (ref 0–99)
Triglycerides: 83 mg/dL (ref 0–149)
VLDL Cholesterol Cal: 17 mg/dL (ref 5–40)

## 2017-06-19 LAB — COMPREHENSIVE METABOLIC PANEL
A/G RATIO: 2.3 — AB (ref 1.2–2.2)
ALBUMIN: 4.5 g/dL (ref 3.5–5.5)
ALT: 16 IU/L (ref 0–44)
AST: 24 IU/L (ref 0–40)
Alkaline Phosphatase: 85 IU/L (ref 39–117)
BILIRUBIN TOTAL: 0.5 mg/dL (ref 0.0–1.2)
BUN / CREAT RATIO: 12 (ref 9–20)
BUN: 11 mg/dL (ref 6–24)
CHLORIDE: 102 mmol/L (ref 96–106)
CO2: 27 mmol/L (ref 20–29)
Calcium: 9.2 mg/dL (ref 8.7–10.2)
Creatinine, Ser: 0.93 mg/dL (ref 0.76–1.27)
GFR calc Af Amer: 109 mL/min/{1.73_m2} (ref >59)
GFR calc non Af Amer: 95 mL/min/{1.73_m2} (ref >59)
GLOBULIN, TOTAL: 2 g/dL (ref 1.5–4.5)
Glucose: 88 mg/dL (ref 65–99)
POTASSIUM: 5 mmol/L (ref 3.5–5.2)
SODIUM: 139 mmol/L (ref 134–144)
Total Protein: 6.5 g/dL (ref 6.0–8.5)

## 2017-06-19 LAB — CBC
HEMATOCRIT: 40.6 % (ref 37.5–51.0)
HEMOGLOBIN: 13.9 g/dL (ref 13.0–17.7)
MCH: 30.2 pg (ref 26.6–33.0)
MCHC: 34.2 g/dL (ref 31.5–35.7)
MCV: 88 fL (ref 79–97)
Platelets: 220 10*3/uL (ref 150–379)
RBC: 4.6 x10E6/uL (ref 4.14–5.80)
RDW: 12.8 % (ref 12.3–15.4)
WBC: 4 10*3/uL (ref 3.4–10.8)

## 2017-06-19 LAB — HEMOGLOBIN A1C
ESTIMATED AVERAGE GLUCOSE: 103 mg/dL
Hgb A1c MFr Bld: 5.2 % (ref 4.8–5.6)

## 2017-07-23 ENCOUNTER — Ambulatory Visit
Admission: RE | Admit: 2017-07-23 | Discharge: 2017-07-23 | Disposition: A | Discharge status: Home / Self Care | Payer: BC Managed Care – PPO | Source: Ambulatory Visit | Attending: Otolaryngology | Admitting: Otolaryngology

## 2017-07-23 DIAGNOSIS — E041 Nontoxic single thyroid nodule: Secondary | ICD-10-CM | POA: Insufficient documentation

## 2017-07-26 ENCOUNTER — Other Ambulatory Visit: Payer: Self-pay | Admitting: Family Medicine

## 2017-07-28 NOTE — Telephone Encounter (Signed)
faxed

## 2017-07-28 NOTE — Telephone Encounter (Signed)
Please fax

## 2017-07-28 NOTE — Telephone Encounter (Signed)
Last Fill 6/18 dispensed 30 with 2 refills, last 8/18 ok to fill?

## 2017-08-21 ENCOUNTER — Other Ambulatory Visit: Payer: Self-pay | Admitting: Family Medicine

## 2017-10-21 ENCOUNTER — Other Ambulatory Visit: Payer: Self-pay | Admitting: Family Medicine

## 2017-10-26 ENCOUNTER — Other Ambulatory Visit: Payer: Self-pay | Admitting: Family Medicine

## 2017-10-27 ENCOUNTER — Telehealth: Payer: Self-pay

## 2017-10-27 NOTE — Telephone Encounter (Signed)
Refilled: 07/28/2017 Last OV: 06/18/2017 Next OV: not scheduled

## 2017-10-27 NOTE — Telephone Encounter (Signed)
Copied from [place] [phone]. Topic: Appointment Scheduling - Scheduling Inquiry for Clinic >> Oct 27, 2017  2:49 PM Austin [name] wrote: Reason for CRM: vyvanse prescription refill needing an appt between 12/24 and 1/18 via mychart message but Dr. Caryl Bis is completely booked.

## 2017-10-28 NOTE — Telephone Encounter (Signed)
Needs follow-up in the next several months. Refill can be faxed.

## 2017-10-29 NOTE — Telephone Encounter (Signed)
Pt is scheduled for 12/24.

## 2017-10-31 NOTE — Telephone Encounter (Signed)
scheduled

## 2017-11-03 ENCOUNTER — Ambulatory Visit (INDEPENDENT_AMBULATORY_CARE_PROVIDER_SITE_OTHER): Payer: BC Managed Care – PPO | Admitting: Family Medicine

## 2017-11-03 ENCOUNTER — Ambulatory Visit
Admission: RE | Admit: 2017-11-03 | Discharge: 2017-11-03 | Disposition: A | Discharge status: Home / Self Care | Payer: BC Managed Care – PPO | Source: Ambulatory Visit | Attending: Family Medicine | Admitting: Family Medicine

## 2017-11-03 ENCOUNTER — Encounter: Payer: Self-pay | Admitting: Family Medicine

## 2017-11-03 ENCOUNTER — Other Ambulatory Visit: Payer: Self-pay

## 2017-11-03 ENCOUNTER — Telehealth: Payer: Self-pay | Admitting: Family Medicine

## 2017-11-03 VITALS — BP 142/90 | HR 54 | Temp 97.8°F | Wt 207.0 lb

## 2017-11-03 DIAGNOSIS — M7989 Other specified soft tissue disorders: Secondary | ICD-10-CM | POA: Diagnosis not present

## 2017-11-03 DIAGNOSIS — F909 Attention-deficit hyperactivity disorder, unspecified type: Secondary | ICD-10-CM | POA: Diagnosis not present

## 2017-11-03 DIAGNOSIS — M79661 Pain in right lower leg: Secondary | ICD-10-CM | POA: Diagnosis not present

## 2017-11-03 DIAGNOSIS — I10 Essential (primary) hypertension: Secondary | ICD-10-CM | POA: Diagnosis not present

## 2017-11-03 MED ORDER — LISDEXAMFETAMINE DIMESYLATE 20 MG PO CAPS: 20.0000 mg | ORAL_CAPSULE | ORAL | 0 refills | Status: DC

## 2017-11-03 MED ORDER — HYDROCHLOROTHIAZIDE 12.5 MG PO TABS: 12.5000 mg | ORAL_TABLET | Freq: Every day | ORAL | 1 refills | Status: DC

## 2017-11-03 NOTE — Assessment & Plan Note (Signed)
Above goal.  He was unable to tolerate amlodipine.  We will trial hydrochlorothiazide.  He will return in 1 month for repeat lab work and BP check.

## 2017-11-03 NOTE — Assessment & Plan Note (Signed)
Patient has been tolerating Vyvanse.  It does not seem like this is playing a role with his blood pressure given that his blood pressure remains elevated despite him not taking his Vyvanse for the last several days.  We will refill this.

## 2017-11-03 NOTE — Progress Notes (Signed)
Austin Rumps, MD Phone: [phone]  Austin [name] is a 51 y.o. male who presents today for follow-up.  Patient notes over the last couple of weeks he has had right lower extremity swelling that is quite a bit worse than the left lower extremity.  He noted a nodule in the left mid to medial anterior calf that was slightly red initially.  He notes this nodule remains.  The swelling remains throughout the day though does get worse and he shows me a picture where it is significantly more swollen.  He did injure himself after noticing this issue by hitting a chair and does note some discomfort from this though also had pain prior to injury.  He has not had swelling like this previously.  No history of DVT.  No recent travel or surgeries.  ADHD: Vyvanse is very helpful.  No weight changes, appetite changes, palpitations, or sleep difficulty.  Hypertension: Typically in the 130s over 90s at home.  He tried amlodipine though had significant swelling with that.  No chest pain or shortness of breath.  Notes his blood pressure has been high even when he has been off of the Vyvanse.  Social History   Tobacco Use  Smoking Status Former Smoker  . Packs/day: 1.00  . Years: 30.00  . Pack years: 30.00  . Last attempt to quit: 01/10/2012  . Years since quitting: 5.8  Smokeless Tobacco Never Used     ROS see history of present illness  Objective  Physical Exam Vitals:   11/03/17 1054 11/03/17 1133  BP: (!) 160/110 (!) 142/90  Pulse: (!) 54   Temp: 97.8 F (36.6 C)   SpO2: 97%     BP Readings from Last 3 Encounters:  11/03/17 (!) 142/90  06/18/17 138/80  04/21/17 130/90   Wt Readings from Last 3 Encounters:  11/03/17 207 lb (93.9 kg)  06/18/17 198 lb 6 oz (90 kg)  04/21/17 204 lb 6.4 oz (92.7 kg)    Physical Exam  Constitutional: No distress.  Cardiovascular: Normal rate, regular rhythm and normal heart sounds.  Pulmonary/Chest: Effort normal and breath sounds normal.    Musculoskeletal:  Right lower extremity with 1-2+ pitting edema from the upper mid shin down, there is a nodular area anterior aspect of the calf that is tender, tenderness distal to this as well, no cords palpated on posterior calf palpation, left lower extremity with no edema or tenderness  Neurological: He is alert. Gait normal.  Skin: Skin is warm and dry. He is not diaphoretic.     Assessment/Plan: Please see individual problem list.  Hypertension Above goal.  He was unable to tolerate amlodipine.  We will trial hydrochlorothiazide.  He will return in 1 month for repeat lab work and BP check.  Pain and swelling of right lower leg New onset unilateral right leg swelling.  It has not significantly improved.  He does have a nodule anteriorly.  Discussed that this could be a potential hematoma related to when he hit his leg.  It would seem unlikely to be related to an insect bite given persistence.  The amount of swelling he has in his leg was certainly impressive in the picture he brought in.  He does have swelling today.  The concern given the difference between the legs would be for DVT though he does not have any risk factors for this.  We will obtain an ultrasound to rule out DVT.  Discussed if that is negative obtaining an x-ray to rule  out bony lesion.  If persists could consider having him see a surgeon evaluate the area of the nodule.  Given return precautions.  ADHD Patient has been tolerating Vyvanse.  It does not seem like this is playing a role with his blood pressure given that his blood pressure remains elevated despite him not taking his Vyvanse for the last several days.  We will refill this.   [name] was seen today for follow-up.  Diagnoses and all orders for this visit:  Pain and swelling of right lower leg -     US Venous Img Lower Unilateral Right; Future  Essential hypertension -     Basic Metabolic Panel (BMET); Future  Attention deficit hyperactivity disorder  (ADHD), unspecified ADHD type  Other orders -     hydrochlorothiazide (HYDRODIURIL) 12.5 MG tablet; Take 1 tablet (12.5 mg total) by mouth daily. -     lisdexamfetamine (VYVANSE) 20 MG capsule; Take 1 capsule (20 mg total) by mouth every morning. -     lisdexamfetamine (VYVANSE) 20 MG capsule; Take 1 capsule (20 mg total) by mouth every morning. Do not fill until 01/04/18 -     lisdexamfetamine (VYVANSE) 20 MG capsule; Take 1 capsule (20 mg total) by mouth every morning. Do not fill until 12/04/17    Orders Placed This Encounter  Procedures  . US Venous Img Lower Unilateral Right    Call report [phone] Dr. Vernetta Honey    Standing Status:   Future    Number of Occurrences:   1    Standing Expiration Date:   01/04/2019    Order Specific Question:   Reason for Exam (SYMPTOM  OR DIAGNOSIS REQUIRED)    Answer:   right leg pain and swelling, knot noted medial to tibia over anterior calf    Order Specific Question:   Preferred imaging location?    Answer:   [facility]    Order Specific Question:   Call Results- Best Contact Number?    Answer:   [phone]  . Basic Metabolic Panel (BMET)    Standing Status:   Future    Standing Expiration Date:   11/03/2018    Meds ordered this encounter  Medications  . hydrochlorothiazide (HYDRODIURIL) 12.5 MG tablet    Sig: Take 1 tablet (12.5 mg total) by mouth daily.    Dispense:  90 tablet    Refill:  1  . lisdexamfetamine (VYVANSE) 20 MG capsule    Sig: Take 1 capsule (20 mg total) by mouth every morning.    Dispense:  30 capsule    Refill:  0  . lisdexamfetamine (VYVANSE) 20 MG capsule    Sig: Take 1 capsule (20 mg total) by mouth every morning. Do not fill until 01/04/18    Dispense:  30 capsule    Refill:  0  . lisdexamfetamine (VYVANSE) 20 MG capsule    Sig: Take 1 capsule (20 mg total) by mouth every morning. Do not fill until 12/04/17    Dispense:  30 capsule    Refill:  0     Austin Rumps, MD [place]

## 2017-11-03 NOTE — Telephone Encounter (Signed)
Spoke with patient regarding [place] results. Advised no DVT. Appears to have a fluid collection in the area of concern. Based on [place] and my exam I feel this is likely related to a hematoma. Discussed warm compresses and ibuprofen or aleve for discomfort. If not improving in the next several weeks he will let us know.

## 2017-11-03 NOTE — Assessment & Plan Note (Signed)
New onset unilateral right leg swelling.  It has not significantly improved.  He does have a nodule anteriorly.  Discussed that this could be a potential hematoma related to when he hit his leg.  It would seem unlikely to be related to an insect bite given persistence.  The amount of swelling he has in his leg was certainly impressive in the picture he brought in.  He does have swelling today.  The concern given the difference between the legs would be for DVT though he does not have any risk factors for this.  We will obtain an ultrasound to rule out DVT.  Discussed if that is negative obtaining an x-ray to rule out bony lesion.  If persists could consider having him see a surgeon evaluate the area of the nodule.  Given return precautions.

## 2017-11-03 NOTE — Patient Instructions (Signed)
Nice to see you. We will get an ultrasound to make sure there is no blood clot in your leg given your swelling. We will start you on hydrochlorothiazide for your blood pressure.  You will need blood work in 1 month.  Please set up an appointment for this. If you develop chest pain or trouble breathing please be evaluated.

## 2017-11-18 ENCOUNTER — Other Ambulatory Visit: Payer: Self-pay

## 2017-11-18 MED ORDER — SERTRALINE HCL 50 MG PO TABS: 50.0000 mg | ORAL_TABLET | Freq: Every day | ORAL | 1 refills | Status: DC

## 2017-12-04 ENCOUNTER — Ambulatory Visit (INDEPENDENT_AMBULATORY_CARE_PROVIDER_SITE_OTHER): Payer: BC Managed Care – PPO | Admitting: *Deleted

## 2017-12-04 DIAGNOSIS — I10 Essential (primary) hypertension: Secondary | ICD-10-CM | POA: Diagnosis not present

## 2017-12-04 LAB — BASIC METABOLIC PANEL
BUN: 9 mg/dL (ref 6–23)
CALCIUM: 9 mg/dL (ref 8.4–10.5)
CO2: 31 mEq/L (ref 19–32)
Chloride: 100 mEq/L (ref 96–112)
Creatinine, Ser: 0.89 mg/dL (ref 0.40–1.50)
GFR: 95.54 mL/min (ref >60.00)
GLUCOSE: 98 mg/dL (ref 70–99)
Potassium: 4.2 mEq/L (ref 3.5–5.1)
SODIUM: 137 meq/L (ref 135–145)

## 2018-01-10 ENCOUNTER — Other Ambulatory Visit: Payer: Self-pay | Admitting: Family Medicine

## 2018-01-12 ENCOUNTER — Other Ambulatory Visit: Payer: Self-pay | Admitting: Otolaryngology

## 2018-01-12 DIAGNOSIS — E041 Nontoxic single thyroid nodule: Secondary | ICD-10-CM

## 2018-01-19 ENCOUNTER — Other Ambulatory Visit: Payer: Self-pay | Admitting: Family Medicine

## 2018-01-20 NOTE — Telephone Encounter (Signed)
Last OV 11/03/17 last filled 10/28/17 30 2rf

## 2018-02-02 ENCOUNTER — Encounter: Payer: Self-pay | Admitting: Family Medicine

## 2018-02-02 ENCOUNTER — Ambulatory Visit: Payer: BC Managed Care – PPO | Admitting: Family Medicine

## 2018-02-02 ENCOUNTER — Other Ambulatory Visit: Payer: Self-pay

## 2018-02-02 VITALS — BP 130/90 | HR 76 | Temp 98.3°F | Wt 205.0 lb

## 2018-02-02 DIAGNOSIS — M7989 Other specified soft tissue disorders: Secondary | ICD-10-CM

## 2018-02-02 DIAGNOSIS — I10 Essential (primary) hypertension: Secondary | ICD-10-CM | POA: Diagnosis not present

## 2018-02-02 DIAGNOSIS — M79661 Pain in right lower leg: Secondary | ICD-10-CM

## 2018-02-02 DIAGNOSIS — F909 Attention-deficit hyperactivity disorder, unspecified type: Secondary | ICD-10-CM

## 2018-02-02 DIAGNOSIS — K219 Gastro-esophageal reflux disease without esophagitis: Secondary | ICD-10-CM | POA: Diagnosis not present

## 2018-02-02 MED ORDER — HYDROCHLOROTHIAZIDE 25 MG PO TABS: 25.0000 mg | ORAL_TABLET | Freq: Every day | ORAL | 1 refills | Status: DC

## 2018-02-02 MED ORDER — LISDEXAMFETAMINE DIMESYLATE 20 MG PO CAPS: 20.0000 mg | ORAL_CAPSULE | ORAL | 0 refills | Status: DC

## 2018-02-02 MED ORDER — OMEPRAZOLE 20 MG PO CPDR: 20.0000 mg | DELAYED_RELEASE_CAPSULE | Freq: Every day | ORAL | 1 refills | Status: DC

## 2018-02-02 NOTE — Progress Notes (Signed)
Austin Rumps, MD Phone: [phone]  Austin [name] is a 52 y.o. male who presents today for f/u.  HYPERTENSION  Disease Monitoring  Home BP Monitoring 130s/90s Chest pain- no    Dyspnea- no Medications  Compliance-  Taking HCTZ, propranolol  Edema- no  Lesion on right leg from prior found to be a likely hematoma. It has almost resolved. No pain.  ADHD: vyvanse 5 days a week. No sleep issues, palpitations, appetite changes, or weight loss. Works well for him.  GERD: taking prilosec with rare symptoms if he eats tomato sauce. No abdominal pain or blood in his stool.     Social History   Tobacco Use  Smoking Status Former Smoker  . Packs/day: 1.00  . Years: 30.00  . Pack years: 30.00  . Last attempt to quit: 01/10/2012  . Years since quitting: 6.0  Smokeless Tobacco Never Used     ROS see history of present illness  Objective  Physical Exam Vitals:   02/02/18 1336  BP: 130/90  Pulse: 76  Temp: 98.3 F (36.8 C)  SpO2: 95%    BP Readings from Last 3 Encounters:  02/02/18 130/90  11/03/17 (!) 142/90  06/18/17 138/80   Wt Readings from Last 3 Encounters:  02/02/18 205 lb (93 kg)  11/03/17 207 lb (93.9 kg)  06/18/17 198 lb 6 oz (90 kg)    Physical Exam  Constitutional: No distress.  Cardiovascular: Normal rate, regular rhythm and normal heart sounds.  Pulmonary/Chest: Effort normal and breath sounds normal.  Musculoskeletal: He exhibits no edema.  Neurological: He is alert. Gait normal.  Skin: Skin is warm and dry. He is not diaphoretic.  Minimal nodule in area of prior hematoma, nontender     Assessment/Plan: Please see individual problem list.  GERD (gastroesophageal reflux disease) Stable. Refill prilosec.   Hypertension Above goal. Increase HCTZ to 25 mg daily. Recheck BP and BMET in one month.   Pain and swelling of right lower leg This was related to a hematoma. This has almost resolved. If not continuing to improve he will let us know.  Discussed it can take weeks to months to fully resolve.   ADHD Well-controlled.  Continue Vyvanse.  Refill sent to pharmacy.  Orders Placed This Encounter  Procedures  . Basic Metabolic Panel (BMET)    Standing Status:   Future    Standing Expiration Date:   02/03/2019    Meds ordered this encounter  Medications  . hydrochlorothiazide (HYDRODIURIL) 25 MG tablet    Sig: Take 1 tablet (25 mg total) by mouth daily.    Dispense:  90 tablet    Refill:  1  . omeprazole (PRILOSEC) 20 MG capsule    Sig: Take 1 capsule (20 mg total) by mouth daily.    Dispense:  90 capsule    Refill:  1  . lisdexamfetamine (VYVANSE) 20 MG capsule    Sig: Take 1 capsule (20 mg total) by mouth every morning.    Dispense:  30 capsule    Refill:  0  . lisdexamfetamine (VYVANSE) 20 MG capsule    Sig: Take 1 capsule (20 mg total) by mouth every morning.    Dispense:  30 capsule    Refill:  0  . lisdexamfetamine (VYVANSE) 20 MG capsule    Sig: Take 1 capsule (20 mg total) by mouth every morning.    Dispense:  30 capsule    Refill:  0     Austin Rumps, MD [facility]  [employer]

## 2018-02-02 NOTE — Assessment & Plan Note (Signed)
Well-controlled.  Continue Vyvanse.  Refill sent to pharmacy.

## 2018-02-02 NOTE — Assessment & Plan Note (Signed)
Above goal. Increase HCTZ to 25 mg daily. Recheck BP and BMET in one month.

## 2018-02-02 NOTE — Assessment & Plan Note (Signed)
This was related to a hematoma. This has almost resolved. If not continuing to improve he will let us know. Discussed it can take weeks to months to fully resolve.

## 2018-02-02 NOTE — Assessment & Plan Note (Signed)
Stable. Refill prilosec.

## 2018-02-02 NOTE — Patient Instructions (Signed)
Nice to see you. We will increase your HCTZ to 25 mg daily. We will have you return in one month for labs.  Please monitor your leg and if not continuing to improve please let us know.

## 2018-02-17 ENCOUNTER — Encounter: Payer: Self-pay | Admitting: Family Medicine

## 2018-02-17 DIAGNOSIS — I10 Essential (primary) hypertension: Secondary | ICD-10-CM

## 2018-02-17 MED ORDER — LISINOPRIL 10 MG PO TABS: 10.0000 mg | ORAL_TABLET | Freq: Every day | ORAL | 3 refills | Status: DC

## 2018-02-17 NOTE — Telephone Encounter (Signed)
Please call the patient to set up a lab appointment for a BMET in 7-10 days. He will need a BP check in one month. Thanks.

## 2018-03-02 ENCOUNTER — Other Ambulatory Visit (INDEPENDENT_AMBULATORY_CARE_PROVIDER_SITE_OTHER): Payer: BC Managed Care – PPO

## 2018-03-02 DIAGNOSIS — I10 Essential (primary) hypertension: Secondary | ICD-10-CM | POA: Diagnosis not present

## 2018-03-02 LAB — BASIC METABOLIC PANEL
BUN: 10 mg/dL (ref 6–23)
CHLORIDE: 104 meq/L (ref 96–112)
CO2: 28 meq/L (ref 19–32)
Calcium: 9.6 mg/dL (ref 8.4–10.5)
Creatinine, Ser: 0.93 mg/dL (ref 0.40–1.50)
GFR: 90.73 mL/min (ref >60.00)
Glucose, Bld: 101 mg/dL — ABNORMAL HIGH (ref 70–99)
POTASSIUM: 5 meq/L (ref 3.5–5.1)
Sodium: 140 mEq/L (ref 135–145)

## 2018-03-05 ENCOUNTER — Ambulatory Visit (INDEPENDENT_AMBULATORY_CARE_PROVIDER_SITE_OTHER): Payer: BC Managed Care – PPO

## 2018-03-05 ENCOUNTER — Other Ambulatory Visit: Payer: Self-pay

## 2018-03-05 ENCOUNTER — Ambulatory Visit: Payer: Self-pay

## 2018-03-05 VITALS — BP 136/88 | HR 67

## 2018-03-05 DIAGNOSIS — I10 Essential (primary) hypertension: Secondary | ICD-10-CM

## 2018-03-05 NOTE — Progress Notes (Signed)
Patient presents today for blood pressure check. Patient is currently just taking lisinopril 10mg  once daily. Patient states that has has been taking this for about 10 days now. Patient's blood pressure today is 136/88 pulse 67.

## 2018-03-05 NOTE — Progress Notes (Signed)
Agree with plan of lisinopril 10 mg once daily. BP is WNL  I have reviewed and agree with note, evaluation, plan.   Delano Metz, FNP

## 2018-04-22 ENCOUNTER — Other Ambulatory Visit: Payer: Self-pay | Admitting: Family Medicine

## 2018-04-23 NOTE — Telephone Encounter (Signed)
Sent to pharmacy.  Drug database reviewed. 

## 2018-04-23 NOTE — Telephone Encounter (Signed)
Last OV 02/02/18 last filled 01/20/18 30 2rf

## 2018-05-05 ENCOUNTER — Encounter: Payer: Self-pay | Admitting: Family Medicine

## 2018-05-05 ENCOUNTER — Ambulatory Visit: Payer: Self-pay | Admitting: Family Medicine

## 2018-05-05 ENCOUNTER — Ambulatory Visit (INDEPENDENT_AMBULATORY_CARE_PROVIDER_SITE_OTHER): Payer: BC Managed Care – PPO | Admitting: Family Medicine

## 2018-05-05 VITALS — BP 130/90 | HR 66 | Temp 97.5°F | Ht 69.0 in | Wt 208.8 lb

## 2018-05-05 DIAGNOSIS — F909 Attention-deficit hyperactivity disorder, unspecified type: Secondary | ICD-10-CM | POA: Diagnosis not present

## 2018-05-05 DIAGNOSIS — F411 Generalized anxiety disorder: Secondary | ICD-10-CM

## 2018-05-05 DIAGNOSIS — I10 Essential (primary) hypertension: Secondary | ICD-10-CM

## 2018-05-05 LAB — BASIC METABOLIC PANEL
BUN: 11 mg/dL (ref 6–23)
CALCIUM: 9.5 mg/dL (ref 8.4–10.5)
CO2: 31 mEq/L (ref 19–32)
CREATININE: 0.97 mg/dL (ref 0.40–1.50)
Chloride: 103 mEq/L (ref 96–112)
GFR: 86.36 mL/min (ref >60.00)
Glucose, Bld: 92 mg/dL (ref 70–99)
Potassium: 4.8 mEq/L (ref 3.5–5.1)
SODIUM: 138 meq/L (ref 135–145)

## 2018-05-05 MED ORDER — LISDEXAMFETAMINE DIMESYLATE 20 MG PO CAPS: 20.0000 mg | ORAL_CAPSULE | ORAL | 0 refills | Status: DC

## 2018-05-05 MED ORDER — LISINOPRIL 20 MG PO TABS: 20.0000 mg | ORAL_TABLET | Freq: Every day | ORAL | 1 refills | Status: DC

## 2018-05-05 NOTE — Assessment & Plan Note (Signed)
Blood pressure is not adequately controlled.  We will increase lisinopril.  BMP today and BMP in 1 week.  BP check in 3 weeks with nursing.

## 2018-05-05 NOTE — Assessment & Plan Note (Signed)
Well-controlled.  He will continue Zoloft.

## 2018-05-05 NOTE — Progress Notes (Signed)
  Tommi Rumps, MD Phone: [phone]  Austin [name] is a 52 y.o. male who presents today for f/u.  CC: htn, adhd, anxiety  HYPERTENSION  Disease Monitoring  Home BP Monitoring similar to today Chest pain- no    Dyspnea- no Medications  Compliance-  Taking lisinopril.  Edema- no  ADHD Medication: vyvanse 20 mg daily Effectiveness: not quite as effective as previously, notices he is doodling more Palpitations: no Sleep difficulty: no Appetite suppression: no  Anxiety: Patient notes that Zoloft is helped significantly.  He is not having any panicky sensations.  No depression.  He has not had to use the propranolol.     Social History   Tobacco Use  Smoking Status Former Smoker  . Packs/day: 1.00  . Years: 30.00  . Pack years: 30.00  . Last attempt to quit: 01/10/2012  . Years since quitting: 6.3  Smokeless Tobacco Never Used     ROS see history of present illness  Objective  Physical Exam Vitals:   05/05/18 0854  BP: 130/90  Pulse: 66  Temp: (!) 97.5 F (36.4 C)  SpO2: 97%    BP Readings from Last 3 Encounters:  05/05/18 130/90  03/05/18 136/88  02/02/18 130/90   Wt Readings from Last 3 Encounters:  05/05/18 208 lb 12.8 oz (94.7 kg)  02/02/18 205 lb (93 kg)  11/03/17 207 lb (93.9 kg)    Physical Exam  Constitutional: No distress.  Cardiovascular: Normal rate, regular rhythm and normal heart sounds.  Pulmonary/Chest: Effort normal and breath sounds normal.  Musculoskeletal: He exhibits no edema.  Neurological: He is alert.  Skin: Skin is warm and dry. He is not diaphoretic.     Assessment/Plan: Please see individual problem list.  Hypertension Blood pressure is not adequately controlled.  We will increase lisinopril.  BMP today and BMP in 1 week.  BP check in 3 weeks with nursing.  ADHD Continue 20 mg of Vyvanse at this time.  If his blood pressure is better controlled at follow-up in 3 weeks we will increase the Vyvanse dose.  In the past  he is come off of the Vyvanse and his blood pressure has remained elevated so it may not be contributing.  Anxiety state Well-controlled.  He will continue Zoloft.   Orders Placed This Encounter  Procedures  . Basic Metabolic Panel (BMET)    Standing Status:   Future    Standing Expiration Date:   05/06/2019  . Basic Metabolic Panel (BMET)    Meds ordered this encounter  Medications  . lisinopril (PRINIVIL,ZESTRIL) 20 MG tablet    Sig: Take 1 tablet (20 mg total) by mouth daily.    Dispense:  90 tablet    Refill:  1  . lisdexamfetamine (VYVANSE) 20 MG capsule    Sig: Take 1 capsule (20 mg total) by mouth every morning.    Dispense:  30 capsule    Refill:  0     Tommi Rumps, MD [place]

## 2018-05-05 NOTE — Assessment & Plan Note (Signed)
Continue 20 mg of Vyvanse at this time.  If his blood pressure is better controlled at follow-up in 3 weeks we will increase the Vyvanse dose.  In the past he is come off of the Vyvanse and his blood pressure has remained elevated so it may not be contributing.

## 2018-05-05 NOTE — Patient Instructions (Signed)
Nice to see you.  We will increase your lisinopril to 20 mg daily.  We will check lab work today and in 1 week.  We will have you return in 3 weeks for a blood pressure check with nursing.  If at that time your blood pressure is better controlled we can increase the Vyvanse.

## 2018-05-12 ENCOUNTER — Other Ambulatory Visit (INDEPENDENT_AMBULATORY_CARE_PROVIDER_SITE_OTHER): Payer: BC Managed Care – PPO

## 2018-05-12 DIAGNOSIS — I10 Essential (primary) hypertension: Secondary | ICD-10-CM | POA: Diagnosis not present

## 2018-05-12 LAB — BASIC METABOLIC PANEL
BUN: 11 mg/dL (ref 6–23)
CALCIUM: 9.1 mg/dL (ref 8.4–10.5)
CHLORIDE: 103 meq/L (ref 96–112)
CO2: 31 meq/L (ref 19–32)
CREATININE: 0.95 mg/dL (ref 0.40–1.50)
GFR: 88.46 mL/min (ref >60.00)
GLUCOSE: 82 mg/dL (ref 70–99)
Potassium: 4.6 mEq/L (ref 3.5–5.1)
Sodium: 139 mEq/L (ref 135–145)

## 2018-05-16 ENCOUNTER — Other Ambulatory Visit: Payer: Self-pay | Admitting: Family Medicine

## 2018-05-28 ENCOUNTER — Ambulatory Visit (INDEPENDENT_AMBULATORY_CARE_PROVIDER_SITE_OTHER): Payer: BC Managed Care – PPO | Admitting: *Deleted

## 2018-05-28 ENCOUNTER — Encounter: Payer: Self-pay | Admitting: *Deleted

## 2018-05-28 VITALS — BP 128/84 | HR 63 | Resp 18

## 2018-05-28 DIAGNOSIS — I10 Essential (primary) hypertension: Secondary | ICD-10-CM | POA: Diagnosis not present

## 2018-05-28 NOTE — Progress Notes (Signed)
Patient in for BP check after increasing lisinopril X 3 weeks ago to 20 mg . Patient BP upon first arrival, left arm 138/84 left arm Pulse 63. Let patient rest for additional 15 minutes BP left arm 128/84 pulse 64. Patient stated he has taken home readings and they are consistent with BP attained in office.

## 2018-05-30 NOTE — Progress Notes (Signed)
Diastolic blood pressure still not quite at goal.  He has 2 options.  We can increase lisinopril further or we could add an additional medication.  If he opts for increase lisinopril he will need a lab check 7 days after increasing it.  He will also need a blood pressure check 3 to 4 weeks after increasing or adding medication.

## 2018-06-01 NOTE — Progress Notes (Signed)
Patient is willing to do what ever PCP thinks is best add new medication or increase lisinopril, patient wants PCP to make decision.

## 2018-06-02 MED ORDER — LISINOPRIL 40 MG PO TABS: 40.0000 mg | ORAL_TABLET | Freq: Every day | ORAL | 1 refills | Status: DC

## 2018-06-02 NOTE — Progress Notes (Signed)
We will increase his lisinopril to 40 mg daily.  A new prescription was sent to his pharmacy.  He will need lab work completed in 7 days.  An order has been placed for that.  Please get him scheduled.  He needs follow-up for blood pressure check in a month with nursing.  He will need follow-up with me as planned in October.

## 2018-06-02 NOTE — Addendum Note (Signed)
Addended by: Leone Haven on: 06/02/2018 02:29 PM   Modules accepted: Orders

## 2018-06-03 NOTE — Progress Notes (Signed)
Patient notified and lab scheduled with nurse visit.

## 2018-06-11 ENCOUNTER — Other Ambulatory Visit (INDEPENDENT_AMBULATORY_CARE_PROVIDER_SITE_OTHER): Payer: BC Managed Care – PPO

## 2018-06-11 DIAGNOSIS — I10 Essential (primary) hypertension: Secondary | ICD-10-CM | POA: Diagnosis not present

## 2018-06-11 LAB — BASIC METABOLIC PANEL
BUN: 11 mg/dL (ref 6–23)
CHLORIDE: 104 meq/L (ref 96–112)
CO2: 31 mEq/L (ref 19–32)
Calcium: 9.5 mg/dL (ref 8.4–10.5)
Creatinine, Ser: 0.92 mg/dL (ref 0.40–1.50)
GFR: 91.77 mL/min (ref >60.00)
Glucose, Bld: 104 mg/dL — ABNORMAL HIGH (ref 70–99)
POTASSIUM: 4.5 meq/L (ref 3.5–5.1)
SODIUM: 139 meq/L (ref 135–145)

## 2018-07-07 ENCOUNTER — Ambulatory Visit (INDEPENDENT_AMBULATORY_CARE_PROVIDER_SITE_OTHER): Payer: BC Managed Care – PPO

## 2018-07-07 VITALS — BP 132/88 | HR 78

## 2018-07-07 DIAGNOSIS — I10 Essential (primary) hypertension: Secondary | ICD-10-CM

## 2018-07-07 NOTE — Progress Notes (Signed)
Patient presents today for blood pressure check. During last office visit patient was told to increase Lisinopril to 40mg  daily. Patient is currently taking Lisinopril 40 daily, patient stated that he only take the propranolol when he has public speaking events. Patients blood pressure today is 130/88 heart rate 75 in left arm and 132/88 in right arm heart rate 78.

## 2018-07-09 NOTE — Progress Notes (Addendum)
Blood pressure remains essentially unchanged.  We should add amlodipine 5 mg daily.  If he is willing I can send this to his pharmacy.

## 2018-07-13 ENCOUNTER — Other Ambulatory Visit: Payer: Self-pay | Admitting: Family Medicine

## 2018-07-15 NOTE — Progress Notes (Signed)
Spoke with pt and he stated that he is okay with starting the amlodipine 5mg .

## 2018-07-16 MED ORDER — AMLODIPINE BESYLATE 5 MG PO TABS: 5.0000 mg | ORAL_TABLET | Freq: Every day | ORAL | 3 refills | Status: DC

## 2018-07-16 NOTE — Addendum Note (Signed)
Addended by: Leone Haven on: 07/16/2018 10:37 AM   Modules accepted: Orders

## 2018-07-16 NOTE — Progress Notes (Signed)
Amlodipine has been sent to pharmacy.  Patient should be scheduled for BP check in 1 month.

## 2018-07-22 NOTE — Progress Notes (Signed)
Spoke with pt and he stated that he has started taking the amlodipine 5mg . Scheduled pt for one month bp check. Pt is aware of appt date and time.

## 2018-07-24 ENCOUNTER — Ambulatory Visit
Admission: RE | Admit: 2018-07-24 | Discharge: 2018-07-24 | Disposition: A | Discharge status: Home / Self Care | Payer: BC Managed Care – PPO | Source: Ambulatory Visit | Attending: Otolaryngology | Admitting: Otolaryngology

## 2018-07-24 DIAGNOSIS — E041 Nontoxic single thyroid nodule: Secondary | ICD-10-CM | POA: Insufficient documentation

## 2018-07-27 ENCOUNTER — Other Ambulatory Visit: Payer: Self-pay | Admitting: Family Medicine

## 2018-07-28 ENCOUNTER — Encounter: Payer: Self-pay | Admitting: Family Medicine

## 2018-07-28 MED ORDER — LISDEXAMFETAMINE DIMESYLATE 20 MG PO CAPS: 20.0000 mg | ORAL_CAPSULE | ORAL | 0 refills | Status: DC

## 2018-07-28 NOTE — Telephone Encounter (Signed)
Last OV 05/05/2018   Last refilled 04/23/2018 disp 30 with 2 refills   sent to PCP to advise

## 2018-07-28 NOTE — Telephone Encounter (Signed)
Controlled substance database reviewed. Sent to pharmacy.   

## 2018-08-04 ENCOUNTER — Ambulatory Visit (INDEPENDENT_AMBULATORY_CARE_PROVIDER_SITE_OTHER): Payer: BC Managed Care – PPO | Admitting: Family Medicine

## 2018-08-04 ENCOUNTER — Ambulatory Visit: Payer: BC Managed Care – PPO

## 2018-08-04 ENCOUNTER — Encounter: Payer: Self-pay | Admitting: Family Medicine

## 2018-08-04 VITALS — BP 118/84 | HR 61 | Temp 98.1°F | Resp 18 | Wt 211.2 lb

## 2018-08-04 DIAGNOSIS — R0609 Other forms of dyspnea: Secondary | ICD-10-CM | POA: Insufficient documentation

## 2018-08-04 DIAGNOSIS — I10 Essential (primary) hypertension: Secondary | ICD-10-CM | POA: Diagnosis not present

## 2018-08-04 DIAGNOSIS — R0600 Dyspnea, unspecified: Secondary | ICD-10-CM | POA: Insufficient documentation

## 2018-08-04 NOTE — Progress Notes (Signed)
  Tommi Rumps, MD Phone: [phone]  Austin [name] is a 52 y.o. male who presents today for f/u.  CC: htn, DOE  Hypertension: Patient has not been checking his blood pressure at home.  He presented today for blood pressure check and was noted to have edema in his bilateral legs up to lower shins.  This started after he started on the amlodipine.  He notes a little bit of fatigue/dyspnea when going upstairs and was fatigued when playing with his dog yesterday.  This is new.  He notes no orthopnea or PND.  No chest pain.  He feels well otherwise.  Social History   Tobacco Use  Smoking Status Former Smoker  . Packs/day: 1.00  . Years: 30.00  . Pack years: 30.00  . Last attempt to quit: 01/10/2012  . Years since quitting: 6.5  Smokeless Tobacco Never Used     ROS see history of present illness  Objective  Physical Exam Vitals:   08/04/18 1529  BP: 118/84  Pulse: 61  Resp: 18  Temp: 98.1 F (36.7 C)  SpO2: 98%    BP Readings from Last 3 Encounters:  08/04/18 118/84  07/07/18 132/88  05/28/18 128/84   Wt Readings from Last 3 Encounters:  08/04/18 211 lb 4 oz (95.8 kg)  05/05/18 208 lb 12.8 oz (94.7 kg)  02/02/18 205 lb (93 kg)    Physical Exam  Constitutional: No distress.  Cardiovascular: Normal rate, regular rhythm and normal heart sounds.  Pulmonary/Chest: Effort normal and breath sounds normal.  Musculoskeletal: He exhibits edema (1+ pitting edema bilateral lower extremities to just above the ankle).  Neurological: He is alert.  Skin: Skin is warm and dry. He is not diaphoretic.   EKG: Sinus bradycardia, inferior Q waves unchanged from prior, no acute ischemic changes  Assessment/Plan: Please see individual problem list.  Dyspnea on exertion Patient with mild exertional fatigue and mild dyspnea when going up stairs recently as well as when playing with his dogs.  He otherwise has no chest pain or other dyspnea.  He does have some bilateral ankle edema  likely related to his amlodipine though could represent a heart failure cause.  We will obtain an EKG.  We will obtain lab work as well.  Consider referral to cardiology pending lab work.  Given return precautions.  Hypertension Borderline controlled.  We will discontinue his amlodipine given the swelling.  We will determine the next step after his labs return.    Orders Placed This Encounter  Procedures  . CBC  . Comp Met (CMET)  . TSH  . B Nat Peptide  . EKG 12-Lead    No orders of the defined types were placed in this encounter.    Tommi Rumps, MD [place]

## 2018-08-04 NOTE — Assessment & Plan Note (Signed)
Patient with mild exertional fatigue and mild dyspnea when going up stairs recently as well as when playing with his dogs.  He otherwise has no chest pain or other dyspnea.  He does have some bilateral ankle edema likely related to his amlodipine though could represent a heart failure cause.  We will obtain an EKG.  We will obtain lab work as well.  Consider referral to cardiology pending lab work.  Given return precautions.

## 2018-08-04 NOTE — Patient Instructions (Signed)
Nice to see you. We will get lab work today and contact you with the results.  Please discontinue your amlodipine.  Will determine if we need to send you to cardiology based on your labs. If you develop chest pain, shortness of breath, or worsening fatigue please be reevaluated.

## 2018-08-04 NOTE — Progress Notes (Signed)
bnp changed to [facility]

## 2018-08-04 NOTE — Assessment & Plan Note (Signed)
Borderline controlled.  We will discontinue his amlodipine given the swelling.  We will determine the next step after his labs return.

## 2018-08-05 LAB — COMPREHENSIVE METABOLIC PANEL
ALBUMIN: 4.4 g/dL (ref 3.5–5.2)
ALK PHOS: 70 U/L (ref 39–117)
ALT: 17 U/L (ref 0–53)
AST: 19 U/L (ref 0–37)
BILIRUBIN TOTAL: 0.4 mg/dL (ref 0.2–1.2)
BUN: 10 mg/dL (ref 6–23)
CHLORIDE: 104 meq/L (ref 96–112)
CO2: 30 mEq/L (ref 19–32)
Calcium: 9.5 mg/dL (ref 8.4–10.5)
Creatinine, Ser: 0.84 mg/dL (ref 0.40–1.50)
GFR: 101.86 mL/min (ref >60.00)
GLUCOSE: 88 mg/dL (ref 70–99)
POTASSIUM: 4.8 meq/L (ref 3.5–5.1)
SODIUM: 140 meq/L (ref 135–145)
Total Protein: 7 g/dL (ref 6.0–8.3)

## 2018-08-05 LAB — BRAIN NATRIURETIC PEPTIDE: BRAIN NATRIURETIC PEPTIDE: 7 pg/mL (ref <100)

## 2018-08-05 LAB — CBC
HEMATOCRIT: 39.6 % (ref 39.0–52.0)
Hemoglobin: 13.3 g/dL (ref 13.0–17.0)
MCHC: 33.6 g/dL (ref 30.0–36.0)
MCV: 90.3 fl (ref 78.0–100.0)
Platelets: 203 10*3/uL (ref 150.0–400.0)
RBC: 4.39 Mil/uL (ref 4.22–5.81)
RDW: 12.5 % (ref 11.5–15.5)
WBC: 4.9 10*3/uL (ref 4.0–10.5)

## 2018-08-05 LAB — TSH: TSH: 1.52 u[IU]/mL (ref 0.35–4.50)

## 2018-08-21 ENCOUNTER — Ambulatory Visit: Payer: BC Managed Care – PPO | Admitting: Family Medicine

## 2018-08-21 ENCOUNTER — Other Ambulatory Visit: Payer: Self-pay

## 2018-08-21 ENCOUNTER — Encounter: Payer: Self-pay | Admitting: Family Medicine

## 2018-08-21 DIAGNOSIS — F909 Attention-deficit hyperactivity disorder, unspecified type: Secondary | ICD-10-CM

## 2018-08-21 DIAGNOSIS — Z23 Encounter for immunization: Secondary | ICD-10-CM

## 2018-08-21 DIAGNOSIS — E669 Obesity, unspecified: Secondary | ICD-10-CM | POA: Diagnosis not present

## 2018-08-21 DIAGNOSIS — I10 Essential (primary) hypertension: Secondary | ICD-10-CM

## 2018-08-21 MED ORDER — LISDEXAMFETAMINE DIMESYLATE 30 MG PO CAPS: 30.0000 mg | ORAL_CAPSULE | ORAL | 0 refills | Status: DC

## 2018-08-21 NOTE — Assessment & Plan Note (Signed)
Slightly uncontrolled.  We will try a slightly higher dose of Vyvanse at 30 mg daily.  He will follow-up in 1 month.

## 2018-08-21 NOTE — Assessment & Plan Note (Signed)
Encouraged diet and exercise.  

## 2018-08-21 NOTE — Assessment & Plan Note (Signed)
Borderline for his diastolic number.  It has remained stable.  At this time we will continue his lisinopril.  He will work on diet and exercise changes to help with his blood pressure.

## 2018-08-21 NOTE — Patient Instructions (Signed)
Nice to see you. Please monitor your blood pressure. We will try Vyvanse 30 mg daily. Please start exercising and monitoring your diet.

## 2018-08-21 NOTE — Progress Notes (Signed)
  Tommi Rumps, MD Phone: [phone]  Austin [name] is a 52 y.o. male who presents today for f/u.  CC: htn, adhd, obesity  HYPERTENSION  Disease Monitoring  Home BP Monitoring similar to today Chest pain- no    Dyspnea- no Medications  Compliance-  Taking lisinopril.   Edema- yes, after starting the amlodipine, bilateral ankles, has come down some since stopping amlodipine, no orthopnea, no PND  ADHD Medication: vyvanse 20 mg Effectiveness: not as effective as previously, he is started doodling again Palpitations: no Sleep difficulty: no Appetite suppression: no  Obesity: He is not exercising.  He is gone back to eating larger quantities.  He does not add salt.  He is going to plan to cut his quantities in half.  He is going to start exercising.     Social History   Tobacco Use  Smoking Status Former Smoker  . Packs/day: 1.00  . Years: 30.00  . Pack years: 30.00  . Last attempt to quit: 01/10/2012  . Years since quitting: 6.6  Smokeless Tobacco Never Used     ROS see history of present illness  Objective  Physical Exam Vitals:   08/21/18 1542  BP: 118/84  Pulse: (!) 58  Temp: 98.1 F (36.7 C)  SpO2: 97%    BP Readings from Last 3 Encounters:  08/21/18 118/84  08/04/18 118/84  07/07/18 132/88   Wt Readings from Last 3 Encounters:  08/21/18 212 lb 3.2 oz (96.3 kg)  08/04/18 211 lb 4 oz (95.8 kg)  05/05/18 208 lb 12.8 oz (94.7 kg)    Physical Exam  Constitutional: No distress.  Cardiovascular: Normal rate, regular rhythm and normal heart sounds.  Pulmonary/Chest: Effort normal and breath sounds normal.  Musculoskeletal: He exhibits edema (1+ pitting edema bilateral legs to lower shin).  Neurological: He is alert.  Skin: Skin is warm and dry. He is not diaphoretic.     Assessment/Plan: Please see individual problem list.  Hypertension Borderline for his diastolic number.  It has remained stable.  At this time we will continue his lisinopril.   He will work on diet and exercise changes to help with his blood pressure.  ADHD Slightly uncontrolled.  We will try a slightly higher dose of Vyvanse at 30 mg daily.  He will follow-up in 1 month.  Obesity (BMI 30-39.9) Encouraged diet and exercise.   Orders Placed This Encounter  Procedures  . Flu Vaccine QUAD 36+ mos IM    Meds ordered this encounter  Medications  . lisdexamfetamine (VYVANSE) 30 MG capsule    Sig: Take 1 capsule (30 mg total) by mouth every morning.    Dispense:  30 capsule    Refill:  0     Tommi Rumps, MD [place]

## 2018-09-23 ENCOUNTER — Encounter: Payer: Self-pay | Admitting: Family Medicine

## 2018-09-23 ENCOUNTER — Ambulatory Visit: Payer: BC Managed Care – PPO | Admitting: Family Medicine

## 2018-09-23 DIAGNOSIS — J989 Respiratory disorder, unspecified: Secondary | ICD-10-CM | POA: Diagnosis not present

## 2018-09-23 DIAGNOSIS — F909 Attention-deficit hyperactivity disorder, unspecified type: Secondary | ICD-10-CM

## 2018-09-23 DIAGNOSIS — I10 Essential (primary) hypertension: Secondary | ICD-10-CM | POA: Diagnosis not present

## 2018-09-23 NOTE — Assessment & Plan Note (Signed)
Likely viral illness.  Discussed supportive care.  Advised to discontinue pseudoephedrine as he does not have any sinus or nasal congestion.  If not improving or worsening he will be reevaluated.  Given return precautions.

## 2018-09-23 NOTE — Assessment & Plan Note (Signed)
He will remain off of Vyvanse.  He will monitor his symptoms.

## 2018-09-23 NOTE — Assessment & Plan Note (Signed)
Borderline to slightly above goal at home recently.  He has been ill the last several days.  We will have him monitor at home and return in 1 week for BP check.  Continue lisinopril.

## 2018-09-23 NOTE — Progress Notes (Signed)
  Tommi Rumps, MD Phone: [phone]  Austin [name] is a 52 y.o. male who presents today for follow-up.  CC: Hypertension, ADHD, respiratory illness  Hypertension: Notes his blood pressure went up with the Vyvanse increase.  He stopped the Vyvanse.  His blood pressure has remained borderline elevated.  He remains on lisinopril.  No chest pain or shortness of breath.  ADHD: He notes he stopped the Vyvanse.  He has done okay though has not been stretched at work.  He wants to stay off of Vyvanse for now.  Respiratory illness: Patient notes symptoms started on Monday around lunchtime.  Started with postnasal drip and feeling tired.  Notes chest congestion and drainage.  No significant sinus congestion.  Cough is nonproductive.  He feels tired and has been sleeping more.  No body aches.  No fevers.  He has had sick contacts.  He has been taking pseudoephedrine.  Social History   Tobacco Use  Smoking Status Former Smoker  . Packs/day: 1.00  . Years: 30.00  . Pack years: 30.00  . Last attempt to quit: 01/10/2012  . Years since quitting: 6.7  Smokeless Tobacco Never Used     ROS see history of present illness  Objective  Physical Exam Vitals:   09/23/18 1617  BP: 140/90  Pulse: 64  Temp: 98.1 F (36.7 C)  SpO2: 98%    BP Readings from Last 3 Encounters:  09/23/18 140/90  08/21/18 118/84  08/04/18 118/84   Wt Readings from Last 3 Encounters:  09/23/18 214 lb 12.8 oz (97.4 kg)  08/21/18 212 lb 3.2 oz (96.3 kg)  08/04/18 211 lb 4 oz (95.8 kg)    Physical Exam  Constitutional: No distress.  HENT:  Head: Normocephalic and atraumatic.  Mild posterior oropharyngeal erythema, no exudates  Eyes: Pupils are equal, round, and reactive to light. Conjunctivae are normal.  Neck: Neck supple.  Cardiovascular: Normal rate, regular rhythm and normal heart sounds.  Pulmonary/Chest: Effort normal and breath sounds normal.  Musculoskeletal: He exhibits no edema.  Lymphadenopathy:      He has no cervical adenopathy.  Neurological: He is alert.  Skin: Skin is warm and dry. He is not diaphoretic.     Assessment/Plan: Please see individual problem list.  Hypertension Borderline to slightly above goal at home recently.  He has been ill the last several days.  We will have him monitor at home and return in 1 week for BP check.  Continue lisinopril.  ADHD He will remain off of Vyvanse.  He will monitor his symptoms.  Respiratory illness Likely viral illness.  Discussed supportive care.  Advised to discontinue pseudoephedrine as he does not have any sinus or nasal congestion.  If not improving or worsening he will be reevaluated.  Given return precautions.     No orders of the defined types were placed in this encounter.   No orders of the defined types were placed in this encounter.    Tommi Rumps, MD [place]

## 2018-09-23 NOTE — Patient Instructions (Signed)
Nice to see you. Please stop the pseudoephedrine.  You can try Mucinex, Flonase, and Zyrtec or Claritin for your symptoms. Please monitor your symptoms and if you do not start to improve please let us know.  If you develop shortness of breath, fevers, or cough productive of blood please seek medical attention immediately. We will have you return in 1 week for BP check.

## 2018-09-30 ENCOUNTER — Ambulatory Visit (INDEPENDENT_AMBULATORY_CARE_PROVIDER_SITE_OTHER): Payer: BC Managed Care – PPO

## 2018-09-30 DIAGNOSIS — I10 Essential (primary) hypertension: Secondary | ICD-10-CM | POA: Diagnosis not present

## 2018-09-30 NOTE — Progress Notes (Signed)
Patient comes in for 1 week blood pressure check . He  is taking Lisinopril 40mg  daily.  He reports blood pressure reading at home last night 137/91 and 138/94 after resting for 15 minutes.  Today blood pressure checked left arm 140/90 right arm 144/96 pulse 80 O2 96%.   Patient denies headache and chest pain.  Please advise.

## 2018-10-01 NOTE — Progress Notes (Addendum)
Blood pressure is uncontrolled.  He needs an additional medication.  If he is willing I can send in carvedilol for him to start on.  Please find out if he is still taking the propranolol as needed.  If he is he will need to discontinue that when we start the carvedilol.  If we start this medication he will need a blood pressure check and pulse check in 2 weeks.

## 2018-10-01 NOTE — Progress Notes (Signed)
Spoke with patient he states that he only takes propranolol as needed for anxiety. Advised him that he needed additional blood pressure medication. He is willing to start blood pressure medication and discontinue propranolol.   Blood pressure check scheduled for 2 weeks.

## 2018-10-02 MED ORDER — CARVEDILOL 3.125 MG PO TABS: 3.1250 mg | ORAL_TABLET | Freq: Two times a day (BID) | ORAL | 3 refills | Status: DC

## 2018-10-02 NOTE — Addendum Note (Signed)
Addended by: Leone Haven on: 10/02/2018 04:30 PM   Modules accepted: Orders

## 2018-10-02 NOTE — Progress Notes (Signed)
Carvedilol sent to pharmacy. 

## 2018-10-15 ENCOUNTER — Ambulatory Visit: Payer: BC Managed Care – PPO

## 2018-10-15 ENCOUNTER — Telehealth: Payer: Self-pay

## 2018-10-15 DIAGNOSIS — Z0130 Encounter for examination of blood pressure without abnormal findings: Secondary | ICD-10-CM

## 2018-10-15 NOTE — Telephone Encounter (Signed)
Patient needs to be seen in the office for this. Please offer him an appointment with me or Lauren tomorrow. If he has had any abdominal pain, chest pain, trouble breathing, light headedness, or other symptoms he needs to go to urgent care or the ED for evaluation. If he has further bleeding he should be evaluated prior to his office visit.

## 2018-10-15 NOTE — Progress Notes (Signed)
Pt is here today for a NV for BP check.   Pt is still using [facility] in [place] is taking all medication on his medication list beside the Prilosec.   Pt stated that he is running on 2 hours of sleep today due to his daughter in law being pregnant and at the hospital her water broke.   RA: 130/90 , O2: 96, P:63   LA: 138/90, O2 97, P 68

## 2018-10-15 NOTE — Telephone Encounter (Signed)
When patient was seen for his BP check on 10/15/2018 he expressed to me some concerns.   Pt stated that in the past week he has noticed a lot of blood in the toilet after his BM's. Pt stated that it happen on 12/4 and 12/2. Pt stated that the whole toilet water was bloody and too bloody for him to tell if blood was in his stool. He did notice blood after wiping. Did not struggle to have his BM's. No pain felt while having these BM's.   Pt is concern since his last Colonoscopy was abnormal and he was advised to follow up in 5 years last colonoscopy was done 2 years ago.   Pt stated that he was told that he did have two cancer colon polyp one was not concerning however, the other was concerning.   Pt is worried due to recently losing a friend due to colon cancer.   Sent to PCP

## 2018-10-15 NOTE — Telephone Encounter (Signed)
Pt has been scheduled for an appt with Lauren.

## 2018-10-16 ENCOUNTER — Encounter: Payer: Self-pay | Admitting: Family Medicine

## 2018-10-16 ENCOUNTER — Ambulatory Visit: Payer: BC Managed Care – PPO | Admitting: Family Medicine

## 2018-10-16 VITALS — BP 128/92 | HR 72 | Temp 98.4°F | Ht 69.0 in | Wt 213.0 lb

## 2018-10-16 DIAGNOSIS — I10 Essential (primary) hypertension: Secondary | ICD-10-CM | POA: Diagnosis not present

## 2018-10-16 DIAGNOSIS — K921 Melena: Secondary | ICD-10-CM | POA: Diagnosis not present

## 2018-10-16 LAB — CBC WITH DIFFERENTIAL/PLATELET
BASOS ABS: 0 10*3/uL (ref 0.0–0.1)
BASOS PCT: 0.9 % (ref 0.0–3.0)
EOS ABS: 0.2 10*3/uL (ref 0.0–0.7)
Eosinophils Relative: 4.4 % (ref 0.0–5.0)
HCT: 41.9 % (ref 39.0–52.0)
HEMOGLOBIN: 14.1 g/dL (ref 13.0–17.0)
LYMPHS PCT: 33.1 % (ref 12.0–46.0)
Lymphs Abs: 1.5 10*3/uL (ref 0.7–4.0)
MCHC: 33.7 g/dL (ref 30.0–36.0)
MCV: 90.6 fl (ref 78.0–100.0)
MONO ABS: 0.4 10*3/uL (ref 0.1–1.0)
Monocytes Relative: 9.7 % (ref 3.0–12.0)
Neutro Abs: 2.4 10*3/uL (ref 1.4–7.7)
Neutrophils Relative %: 51.9 % (ref 43.0–77.0)
Platelets: 238 10*3/uL (ref 150.0–400.0)
RBC: 4.62 Mil/uL (ref 4.22–5.81)
RDW: 12.8 % (ref 11.5–15.5)
WBC: 4.6 10*3/uL (ref 4.0–10.5)

## 2018-10-16 NOTE — Patient Instructions (Signed)
Hemorrhoids    Hemorrhoids are swollen veins in and around the rectum or anus. Hemorrhoids can cause pain, itching, or bleeding. Most of the time, they do not cause serious problems. They usually get better with diet changes, lifestyle changes, and other home treatments.  Follow these instructions at home:  Eating and drinking  · Eat foods that have fiber, such as whole grains, beans, nuts, fruits, and vegetables. Ask your doctor about taking products that have added fiber (fiber supplements).  · Drink enough fluid to keep your pee (urine) clear or pale yellow.  For Pain and Swelling  · Take a warm-water bath (sitz bath) for 20 minutes to ease pain. Do this 3-4 times a day.  · If directed, put ice on the painful area. It may be helpful to use ice between your warm baths.  ¨ Put ice in a plastic bag.  ¨ Place a towel between your skin and the bag.  ¨ Leave the ice on for 20 minutes, 2-3 times a day.  General instructions  · Take over-the-counter and prescription medicines only as told by your doctor.  ¨ Medicated creams and medicines that are inserted into the anus (suppositories) may be used or applied as told.  · Exercise often.  · Go to the bathroom when you have the urge to poop (to have a bowel movement). Do not wait.  · Avoid pushing too hard (straining) when you poop.  · Keep the butt area dry and clean. Use wet toilet paper or moist paper towels.  · Do not sit on the toilet for a long time.  Contact a doctor if:  · You have any of these:  ¨ Pain and swelling that do not get better with treatment or medicine.  ¨ Bleeding that will not stop.  ¨ Trouble pooping or you cannot poop.  ¨ Pain or swelling outside the area of the hemorrhoids.  This information is not intended to replace advice given to you by your health care provider. Make sure you discuss any questions you have with your health care provider.  Document Released: 08/06/2008 Document Revised: 04/04/2016 Document Reviewed: 07/12/2015  Elsevier  Interactive Patient Education © 2018 [employer].   

## 2018-10-16 NOTE — Progress Notes (Signed)
Subjective:    Patient ID: Austin [name], male    DOB: August 18, 1966, 52 y.o.   MRN: [id number]  HPI   Presents to clinic c/o blood in stool.  Patient states the blood is left bright red, this occurred x2 over the course of 1 week.  Patient states when he looked in the toilet bowl, the toilet water looked completely red.  Patient denies any rectal pain.  Patient denies any constipation.  Patient denies any abdominal pain, denies nausea, vomiting or diarrhea.  Last colonoscopy 10.19.17, due again in 5 years. States they did find polyps, so this is why he is to return in 5 years rather than 10 years.   Patient also wants BP monitored due to recently having issues getting BP to stay under control. Coreg recently added to medications about 2 weeks ago.  Patient Active Problem List   Diagnosis Date Noted  . Respiratory illness 09/23/2018  . Dyspnea on exertion 08/04/2018  . Pain and swelling of right lower leg 11/03/2017  . Hypertension 01/06/2017  . Thyroid nodule 01/06/2017  . Chronic neck pain 06/14/2016  . Screening for colon cancer 04/15/2016  . Anxiety state 07/24/2015  . ADHD 03/20/2015  . Skin lesion 03/20/2015  . Obesity (BMI 30-39.9) 08/30/2014  . GERD (gastroesophageal reflux disease) 08/05/2013  . Constipation 08/05/2013  . Nonspecific abnormal electrocardiogram (ECG) (EKG) 08/05/2013  . ED (erectile dysfunction) 08/05/2013   Social History   Tobacco Use  . Smoking status: Former Smoker    Packs/day: 1.00    Years: 30.00    Pack years: 30.00    Last attempt to quit: 01/10/2012    Years since quitting: 6.7  . Smokeless tobacco: Never Used  Substance Use Topics  . Alcohol use: Yes    Alcohol/week: 7.0 standard drinks    Types: 7 Shots of liquor per week    Comment: one drink per night per pt   Review of Systems  Constitutional: Negative for chills, fatigue and fever.  HENT: Negative for congestion, ear pain, sinus pain and sore throat.   Eyes: Negative.     Respiratory: Negative for cough, shortness of breath and wheezing.   Cardiovascular: Negative for chest pain, palpitations and leg swelling.  Gastrointestinal: Negative for abdominal pain, diarrhea, nausea and vomiting.+blood in stool.  Genitourinary: Negative for dysuria, frequency and urgency.  Musculoskeletal: Negative for arthralgias and myalgias.  Skin: Negative for color change, pallor and rash.  Neurological: Negative for syncope, light-headedness and headaches.  Psychiatric/Behavioral: The patient is not nervous/anxious.       Objective:   Physical Exam  Constitutional: He is oriented to person, place, and time. No distress.  Non toxic.   HENT:  Head: Normocephalic and atraumatic.  Eyes: Conjunctivae and EOM are normal. No scleral icterus.  Neck: Neck supple. No tracheal deviation present.  Cardiovascular: Normal rate and regular rhythm.  Pulmonary/Chest: Effort normal and breath sounds normal. No respiratory distress. He has no wheezes. He has no rales.  Abdominal: Soft. Bowel sounds are normal. He exhibits no distension and no mass. There is no tenderness. There is no rebound and no guarding.  Genitourinary:  Genitourinary Comments: Patient declines rectal exam  Musculoskeletal: Normal range of motion. He exhibits no edema.  Neurological: He is alert and oriented to person, place, and time.  Skin: Skin is warm and dry. No pallor.  Psychiatric: He has a normal mood and affect. His behavior is normal.  Nursing note and vitals reviewed.  Vitals:   10/16/18 0918 10/16/18 0940  BP: (!) 130/96 (!) 128/92  Pulse: 72   Temp: 98.4 F (36.9 C)   SpO2: 98%    BP Readings from Last 3 Encounters:  10/16/18 (!) 128/92  09/23/18 140/90  08/21/18 118/84    Assessment & Plan:   Blood in stool - patient declined rectal exam in clinic, but due to description of blood color and occurrence with bowel movement I am suspecting hemorrhoids.  We will do CBC in clinic to check  blood counts and patient also is agreeable to do fecal occult blood testing cards x3.  Patient given handout regarding hemorrhoids and also advised to increase fiber in his diet to avoid harder stools.  If blood in stool continues to recur, most likely will need to return to clinic for rectal exam and further evaluation.  HTN - systolic blood pressure still remains slightly elevated.  Patient encouraged to continue both Coreg and lisinopril and keep a log of blood pressures.  If systolic BP continues to trend high, advised to call office and let us know as he may need to come in for office visit for medication adjustment.  Patient aware he will be contacted in regards to his lab results and stool card results.  He will keep regular scheduled follow-up with PCP as planned.

## 2018-11-01 ENCOUNTER — Other Ambulatory Visit: Payer: Self-pay | Admitting: Family Medicine

## 2018-11-02 ENCOUNTER — Other Ambulatory Visit: Payer: Self-pay

## 2018-11-02 MED ORDER — MELOXICAM 7.5 MG PO TABS: 7.5000 mg | ORAL_TABLET | Freq: Two times a day (BID) | ORAL | 3 refills | Status: DC

## 2018-11-05 NOTE — Progress Notes (Signed)
Called and spoke with patient. Pt advised and voiced understanding. Pt stated that he did start back on the Vyvanse since it didn't seem to help his BP when he stoppe dtaking it. Will call patient back in 1 week if we do NOT hear back from him.

## 2018-11-06 NOTE — Telephone Encounter (Signed)
Sent to pharmacy.  Controlled substance database reviewed.  It appears the patient was supposed to complete stool cards after having rectal bleeding previously.  Please see if he has those to complete and please see if he has had any additional bleeding.  Thanks.

## 2018-11-10 ENCOUNTER — Telehealth: Payer: Self-pay | Admitting: Family Medicine

## 2018-11-10 NOTE — Telephone Encounter (Signed)
The below message was received from Tressie Ellis. Please call the patient and reinforce that he needs to complete the stool cards. Bright red blood indicates a lower GI bleed that could have numerous causes that needs to be evaluated. He should complete the stool cards. Thanks.    "I spoke with patient & he stated that he did not complete stool cards because he hadn't had any further issues. He felts that since it was bright red blood & not "tarry looking" stools that it wasn't anything to worry about. I advised it will still be good to complete stool cards, but patient was unconcerned."

## 2018-11-12 NOTE — Telephone Encounter (Signed)
Called and spoke with patient. Pt advised and voiced understanding. Pt stated that he will do the test.

## 2018-11-19 ENCOUNTER — Other Ambulatory Visit: Payer: Self-pay | Admitting: Family Medicine

## 2018-11-23 ENCOUNTER — Encounter: Payer: Self-pay | Admitting: Family Medicine

## 2018-11-24 ENCOUNTER — Other Ambulatory Visit (INDEPENDENT_AMBULATORY_CARE_PROVIDER_SITE_OTHER): Payer: BC Managed Care – PPO

## 2018-11-24 DIAGNOSIS — K921 Melena: Secondary | ICD-10-CM | POA: Diagnosis not present

## 2018-11-24 LAB — FECAL OCCULT BLOOD, IMMUNOCHEMICAL: Fecal Occult Bld: NEGATIVE

## 2018-11-25 ENCOUNTER — Other Ambulatory Visit: Payer: Self-pay | Admitting: Family Medicine

## 2018-12-08 ENCOUNTER — Encounter: Payer: Self-pay | Admitting: Family Medicine

## 2018-12-08 NOTE — Telephone Encounter (Signed)
Sent to PCP to review BP readings  

## 2018-12-14 MED ORDER — LISDEXAMFETAMINE DIMESYLATE 30 MG PO CAPS: 30.0000 mg | ORAL_CAPSULE | Freq: Every day | ORAL | 0 refills | Status: DC

## 2018-12-14 MED ORDER — CARVEDILOL 6.25 MG PO TABS: 6.2500 mg | ORAL_TABLET | Freq: Two times a day (BID) | ORAL | 1 refills | Status: DC

## 2018-12-25 ENCOUNTER — Ambulatory Visit: Payer: BC Managed Care – PPO | Admitting: Family Medicine

## 2018-12-25 ENCOUNTER — Encounter: Payer: Self-pay | Admitting: Family Medicine

## 2018-12-25 VITALS — BP 120/84 | HR 51 | Temp 97.9°F | Ht 69.0 in | Wt 217.6 lb

## 2018-12-25 DIAGNOSIS — I10 Essential (primary) hypertension: Secondary | ICD-10-CM | POA: Diagnosis not present

## 2018-12-25 DIAGNOSIS — Z23 Encounter for immunization: Secondary | ICD-10-CM

## 2018-12-25 DIAGNOSIS — F411 Generalized anxiety disorder: Secondary | ICD-10-CM

## 2018-12-25 DIAGNOSIS — F909 Attention-deficit hyperactivity disorder, unspecified type: Secondary | ICD-10-CM

## 2018-12-25 MED ORDER — LISDEXAMFETAMINE DIMESYLATE 30 MG PO CAPS: 30.0000 mg | ORAL_CAPSULE | Freq: Every day | ORAL | 0 refills | Status: DC

## 2018-12-25 NOTE — Assessment & Plan Note (Signed)
Asymptomatic.  Continue Zoloft. 

## 2018-12-25 NOTE — Patient Instructions (Signed)
Nice to see you. Please continue to monitor your blood pressure and pulse.  If your pulse is consistently less than 60 please let us know.  If your blood pressures consistently greater than 140/90 please let us know.

## 2018-12-25 NOTE — Assessment & Plan Note (Signed)
Well-controlled.  He will continue Vyvanse.  Refills given.  Controlled substance database reviewed.

## 2018-12-25 NOTE — Progress Notes (Signed)
  Tommi Rumps, MD Phone: [phone]  Austin [name] is a 53 y.o. male who presents today for follow-up.  CC: ADHD, hypertension, anxiety  ADHD: He is back on Vyvanse.  This is been very beneficial.  He has had no palpitations, appetite suppression, or sleep changes.  Hypertension: He is checking it at home though started on the increased dose of carvedilol in the last day or 2.  He notes no chest pain, shortness of breath, or edema.  He is taking lisinopril and carvedilol.  Anxiety: He notes no anxiety or depression.  He continues on Zoloft.  Social History   Tobacco Use  Smoking Status Former Smoker  . Packs/day: 1.00  . Years: 30.00  . Pack years: 30.00  . Last attempt to quit: 01/10/2012  . Years since quitting: 6.9  Smokeless Tobacco Never Used     ROS see history of present illness  Objective  Physical Exam Vitals:   12/25/18 1601  BP: 120/84  Pulse: (!) 51  Temp: 97.9 F (36.6 C)  SpO2: 96%    BP Readings from Last 3 Encounters:  12/25/18 120/84  10/16/18 (!) 128/92  09/23/18 140/90   Wt Readings from Last 3 Encounters:  12/25/18 217 lb 9.6 oz (98.7 kg)  10/16/18 213 lb (96.6 kg)  09/23/18 214 lb 12.8 oz (97.4 kg)    Physical Exam Constitutional:      General: He is not in acute distress.    Appearance: He is not diaphoretic.  Cardiovascular:     Rate and Rhythm: Normal rate and regular rhythm.     Heart sounds: Normal heart sounds.  Pulmonary:     Effort: Pulmonary effort is normal.     Breath sounds: Normal breath sounds.  Musculoskeletal:     Right lower leg: No edema.     Left lower leg: No edema.  Skin:    General: Skin is warm and dry.  Neurological:     Mental Status: He is alert.      Assessment/Plan: Please see individual problem list.  Hypertension Well-controlled in the office today.  He did just started on the higher dose of carvedilol.  He will continue to monitor his blood pressure at home.  He will contact us if it is  greater than 140/90 over the next week.  He will monitor his pulse and contact us if it is consistently less than 60.  He will continue his current regimen.  ADHD Well-controlled.  He will continue Vyvanse.  Refills given.  Controlled substance database reviewed.  Anxiety state Asymptomatic.  Continue Zoloft.    Orders Placed This Encounter  Procedures  . Flu Vaccine QUAD 6+ mos PF IM (Fluarix Quad PF)    Meds ordered this encounter  Medications  . lisdexamfetamine (VYVANSE) 30 MG capsule    Sig: Take 1 capsule (30 mg total) by mouth daily.    Dispense:  30 capsule    Refill:  0  . lisdexamfetamine (VYVANSE) 30 MG capsule    Sig: Take 1 capsule (30 mg total) by mouth daily.    Dispense:  30 capsule    Refill:  0     Tommi Rumps, MD [place]

## 2018-12-25 NOTE — Assessment & Plan Note (Signed)
Well-controlled in the office today.  He did just started on the higher dose of carvedilol.  He will continue to monitor his blood pressure at home.  He will contact us if it is greater than 140/90 over the next week.  He will monitor his pulse and contact us if it is consistently less than 60.  He will continue his current regimen.

## 2019-01-10 ENCOUNTER — Other Ambulatory Visit: Payer: Self-pay | Admitting: Family Medicine

## 2019-01-12 ENCOUNTER — Encounter: Payer: Self-pay | Admitting: Family Medicine

## 2019-01-12 NOTE — Telephone Encounter (Signed)
Sent to PCP to review

## 2019-01-14 NOTE — Telephone Encounter (Signed)
Sent to PCP would you want pt to come in sooner or just wait until his next appt?

## 2019-01-14 NOTE — Telephone Encounter (Signed)
Vyvanse can potentially raise BP due to it being a stimulant.   He can do a trial run of stopping the vyvanse and monitoring his BP readings over next 2 weeks and reporting the log back to [place]  I would be happy to see him in the office before his June appointment if Dr Caryl Bis is full  Please let patient know  Thanks,  Ander Purpura

## 2019-02-03 ENCOUNTER — Other Ambulatory Visit: Payer: Self-pay | Admitting: Family Medicine

## 2019-02-03 NOTE — Telephone Encounter (Signed)
Refilled: 11/06/2018 Last OV: 12/25/2018 Next OV: 04/16/2019

## 2019-03-10 ENCOUNTER — Encounter: Payer: Self-pay | Admitting: Family Medicine

## 2019-03-10 NOTE — Telephone Encounter (Signed)
Sent to PCP to advise 

## 2019-04-16 ENCOUNTER — Encounter: Payer: Self-pay | Admitting: Family Medicine

## 2019-04-16 ENCOUNTER — Ambulatory Visit (INDEPENDENT_AMBULATORY_CARE_PROVIDER_SITE_OTHER): Payer: BC Managed Care – PPO | Admitting: Family Medicine

## 2019-04-16 ENCOUNTER — Other Ambulatory Visit: Payer: Self-pay

## 2019-04-16 DIAGNOSIS — K219 Gastro-esophageal reflux disease without esophagitis: Secondary | ICD-10-CM | POA: Diagnosis not present

## 2019-04-16 DIAGNOSIS — K625 Hemorrhage of anus and rectum: Secondary | ICD-10-CM | POA: Insufficient documentation

## 2019-04-16 DIAGNOSIS — F909 Attention-deficit hyperactivity disorder, unspecified type: Secondary | ICD-10-CM | POA: Diagnosis not present

## 2019-04-16 DIAGNOSIS — E041 Nontoxic single thyroid nodule: Secondary | ICD-10-CM

## 2019-04-16 DIAGNOSIS — I10 Essential (primary) hypertension: Secondary | ICD-10-CM | POA: Diagnosis not present

## 2019-04-16 NOTE — Assessment & Plan Note (Signed)
Patient can restart his Vyvanse.  He will monitor his blood pressure daily and send [place] his readings in 1 week.  If it trends up he will let us know and he will discontinue the Vyvanse.

## 2019-04-16 NOTE — Assessment & Plan Note (Signed)
He will continue to see ENT.

## 2019-04-16 NOTE — Assessment & Plan Note (Signed)
Adequately controlled.  He will continue his current regimen.  He will come in for lab work.
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
## 2019-04-16 NOTE — Assessment & Plan Note (Signed)
Patient with a history of this in the past.  He has had no recurrence.  He had a colonoscopy about 2 years prior to this episode with no concerning findings.  Bleeding likely hemorrhoidal or fissure related.  If he has any recurrence he will contact us.

## 2019-04-16 NOTE — Assessment & Plan Note (Signed)
Adequately controlled.  Continue Prilosec.

## 2019-04-23 ENCOUNTER — Encounter: Payer: Self-pay | Admitting: Family Medicine

## 2019-04-26 MED ORDER — LISDEXAMFETAMINE DIMESYLATE 30 MG PO CAPS: 30.0000 mg | ORAL_CAPSULE | Freq: Every day | ORAL | 0 refills | Status: DC

## 2019-05-04 ENCOUNTER — Encounter: Payer: Self-pay | Admitting: Family Medicine

## 2019-05-13 ENCOUNTER — Other Ambulatory Visit: Payer: Self-pay | Admitting: Family Medicine

## 2019-05-18 ENCOUNTER — Encounter: Payer: Self-pay | Admitting: Family Medicine

## 2019-05-21 ENCOUNTER — Telehealth: Payer: Self-pay

## 2019-05-21 NOTE — Telephone Encounter (Signed)
Patient's medication Vivanse was approved until 2023, pharmacy and patient was notified.  Nesbit Michon,cma

## 2019-05-30 ENCOUNTER — Other Ambulatory Visit: Payer: Self-pay | Admitting: Family Medicine

## 2019-06-12 ENCOUNTER — Other Ambulatory Visit: Payer: Self-pay | Admitting: Family Medicine

## 2019-07-03 ENCOUNTER — Other Ambulatory Visit: Payer: Self-pay | Admitting: Family Medicine

## 2019-08-15 ENCOUNTER — Other Ambulatory Visit: Payer: Self-pay | Admitting: Family Medicine

## 2019-08-16 NOTE — Telephone Encounter (Signed)
This patient needs labs to get a refill on the mobic. It does not look like they were scheduled after his most recent visit. Please call him to get the lab work scheduled. Thanks.

## 2019-09-07 ENCOUNTER — Other Ambulatory Visit: Payer: Self-pay | Admitting: Family Medicine

## 2019-09-09 NOTE — Telephone Encounter (Signed)
Patient needs a follow-up scheduled to receive a refill. Please call him to schedule a follow-up visit for this medication. Thanks.

## 2019-09-15 ENCOUNTER — Other Ambulatory Visit: Payer: Self-pay

## 2019-09-15 ENCOUNTER — Other Ambulatory Visit: Payer: Self-pay | Admitting: Family Medicine

## 2019-09-17 ENCOUNTER — Other Ambulatory Visit: Payer: Self-pay

## 2019-09-17 ENCOUNTER — Ambulatory Visit: Payer: BC Managed Care – PPO | Admitting: Family Medicine

## 2019-09-17 ENCOUNTER — Encounter: Payer: Self-pay | Admitting: Family Medicine

## 2019-09-17 VITALS — BP 110/80 | HR 55 | Temp 97.0°F | Ht 68.0 in | Wt 210.0 lb

## 2019-09-17 DIAGNOSIS — M25521 Pain in right elbow: Secondary | ICD-10-CM | POA: Insufficient documentation

## 2019-09-17 DIAGNOSIS — I10 Essential (primary) hypertension: Secondary | ICD-10-CM | POA: Diagnosis not present

## 2019-09-17 DIAGNOSIS — F909 Attention-deficit hyperactivity disorder, unspecified type: Secondary | ICD-10-CM

## 2019-09-17 DIAGNOSIS — Z23 Encounter for immunization: Secondary | ICD-10-CM | POA: Diagnosis not present

## 2019-09-17 MED ORDER — TRAMADOL HCL 50 MG PO TABS: 50.0000 mg | ORAL_TABLET | Freq: Every day | ORAL | 0 refills | Status: DC | PRN

## 2019-09-17 MED ORDER — LISDEXAMFETAMINE DIMESYLATE 30 MG PO CAPS: 30.0000 mg | ORAL_CAPSULE | Freq: Every day | ORAL | 0 refills | Status: DC

## 2019-09-17 NOTE — Progress Notes (Signed)
Tommi Rumps, MD Phone: [phone]  Austin [name] is a 53 y.o. male who presents today for follow-up.  Hypertension: Typically 120s/80s.  Taking lisinopril and Coreg.  No chest pain, shortness of breath, or edema.  ADHD: Taking Vyvanse.  No appetite suppression, sleep changes, or palpitations.  Vyvanse is beneficial.  Right elbow pain: Patient notes he started having this a couple of years ago in the antecubital fossa though has noted it more recently.  Describes it as a burning sensation.  No injury.  Occasionally will pop.  He started wearing a tennis elbow brace that was placed over the volar aspect of his proximal forearm and notes that did help at his antecubital fossa though shifted to discomfort up to the proximal attachment of his biceps.  He has not tried any medications for this though he does take meloxicam daily.  Social History   Tobacco Use  Smoking Status Former Smoker  . Packs/day: 1.00  . Years: 30.00  . Pack years: 30.00  . Quit date: 01/10/2012  . Years since quitting: 7.6  Smokeless Tobacco Never Used     ROS see history of present illness  Objective  Physical Exam Vitals:   09/17/19 1515 09/17/19 1547  BP: (!) 150/90 110/80  Pulse: (!) 55   Temp: (!) 97 F (36.1 C)   SpO2: 99%     BP Readings from Last 3 Encounters:  09/17/19 110/80  12/25/18 120/84  10/16/18 (!) 128/92   Wt Readings from Last 3 Encounters:  09/17/19 210 lb (95.3 kg)  12/25/18 217 lb 9.6 oz (98.7 kg)  10/16/18 213 lb (96.6 kg)    Physical Exam Constitutional:      General: He is not in acute distress.    Appearance: He is not diaphoretic.  Cardiovascular:     Rate and Rhythm: Normal rate and regular rhythm.     Heart sounds: Normal heart sounds.  Pulmonary:     Effort: Pulmonary effort is normal.     Breath sounds: Normal breath sounds.  Musculoskeletal:       Arms:     Comments: Patient has full range of motion bilateral shoulders passively with no discomfort,  full range of motion bilateral elbows  Skin:    General: Skin is warm and dry.  Neurological:     Mental Status: He is alert.      Assessment/Plan: Please see individual problem list.  Hypertension Adequately controlled on recheck.  He will continue his current regimen.  Check lab work today.  ADHD Well-controlled.  Refill Vyvanse.  Controlled substance database reviewed.  Right elbow pain Suspect biceps tendinitis.  Discussed icing.  Discussed not icing for any longer than 10 minutes due to potential for nerve injury.  Will refer for sports medicine evaluation.  Advised against tennis elbow brace.   Orders Placed This Encounter  Procedures  . Flu Vaccine QUAD 36+ mos IM  . Ambulatory referral to Sports Medicine    Referral Priority:   Routine    Referral Type:   Consultation    Number of Visits Requested:   1    Meds ordered this encounter  Medications  . lisdexamfetamine (VYVANSE) 30 MG capsule    Sig: Take 1 capsule (30 mg total) by mouth daily.    Dispense:  30 capsule    Refill:  0  . lisdexamfetamine (VYVANSE) 30 MG capsule    Sig: Take 1 capsule (30 mg total) by mouth daily.    Dispense:  30 capsule  Refill:  0  . lisdexamfetamine (VYVANSE) 30 MG capsule    Sig: Take 1 capsule (30 mg total) by mouth daily.    Dispense:  30 capsule    Refill:  0  . traMADol (ULTRAM) 50 MG tablet    Sig: Take 1 tablet (50 mg total) by mouth daily as needed.    Dispense:  30 tablet    Refill:  0    This request is for a new prescription for a controlled substance as required by Federal/State law.     Tommi Rumps, MD [place]

## 2019-09-17 NOTE — Assessment & Plan Note (Signed)
Adequately controlled on recheck.  He will continue his current regimen.  Check lab work today.

## 2019-09-17 NOTE — Assessment & Plan Note (Signed)
Suspect biceps tendinitis.  Discussed icing.  Discussed not icing for any longer than 10 minutes due to potential for nerve injury.  Will refer for sports medicine evaluation.  Advised against tennis elbow brace.

## 2019-09-17 NOTE — Assessment & Plan Note (Signed)
Well-controlled.  Refill Vyvanse.  Controlled substance database reviewed.

## 2019-09-17 NOTE — Patient Instructions (Signed)
Nice to see you. We are going to get lab work today and contact you with results. I referred you to sports medicine for your elbow.  Please try icing this for 10 minutes at a time 2-3 times daily.   I have refilled your medications. Please check your blood pressure daily for the next week and send [place] your readings.

## 2019-09-18 LAB — COMPREHENSIVE METABOLIC PANEL
ALT: 16 IU/L (ref 0–44)
AST: 21 IU/L (ref 0–40)
Albumin/Globulin Ratio: 2.4 — ABNORMAL HIGH (ref 1.2–2.2)
Albumin: 4.6 g/dL (ref 3.8–4.9)
Alkaline Phosphatase: 91 IU/L (ref 39–117)
BUN/Creatinine Ratio: 9 (ref 9–20)
BUN: 8 mg/dL (ref 6–24)
Bilirubin Total: 0.3 mg/dL (ref 0.0–1.2)
CO2: 27 mmol/L (ref 20–29)
Calcium: 9.3 mg/dL (ref 8.7–10.2)
Chloride: 104 mmol/L (ref 96–106)
Creatinine, Ser: 0.9 mg/dL (ref 0.76–1.27)
GFR calc Af Amer: 112 mL/min/{1.73_m2} (ref >59)
GFR calc non Af Amer: 97 mL/min/{1.73_m2} (ref >59)
Globulin, Total: 1.9 g/dL (ref 1.5–4.5)
Glucose: 82 mg/dL (ref 65–99)
Potassium: 5.4 mmol/L — ABNORMAL HIGH (ref 3.5–5.2)
Sodium: 141 mmol/L (ref 134–144)
Total Protein: 6.5 g/dL (ref 6.0–8.5)

## 2019-09-18 LAB — LIPID PANEL
Chol/HDL Ratio: 3 ratio (ref 0.0–5.0)
Cholesterol, Total: 161 mg/dL (ref 100–199)
HDL: 53 mg/dL (ref >39)
LDL Chol Calc (NIH): 85 mg/dL (ref 0–99)
Triglycerides: 133 mg/dL (ref 0–149)
VLDL Cholesterol Cal: 23 mg/dL (ref 5–40)

## 2019-09-18 LAB — HEMOGLOBIN A1C
Est. average glucose Bld gHb Est-mCnc: 103 mg/dL
Hgb A1c MFr Bld: 5.2 % (ref 4.8–5.6)

## 2019-09-20 ENCOUNTER — Other Ambulatory Visit: Payer: Self-pay | Admitting: Family Medicine

## 2019-09-20 DIAGNOSIS — E875 Hyperkalemia: Secondary | ICD-10-CM

## 2019-09-22 ENCOUNTER — Telehealth: Payer: Self-pay | Admitting: Family Medicine

## 2019-09-22 ENCOUNTER — Other Ambulatory Visit: Payer: Self-pay

## 2019-09-22 NOTE — Telephone Encounter (Signed)
Patient returning call for lab results. Nurse Triage unavailable at time of call. Please advise.    Copied from [place] [phone]. Topic: Quick Communication - Lab Results (Clinic Use ONLY) >> Sep 22, 2019  4:31 PM Gordy Councilman, CMA wrote: Called patient to inform them of 09/18/2019 lab results. When patient returns call, triage nurse may disclose results.  Nina,cma

## 2019-09-22 NOTE — Telephone Encounter (Signed)
Pt given lab results per notes of Dr. Caryl Bis on 09/20/19. Pt verbalized understanding. Lab appt scheduled on 09/23/19. Unable to document in result note.

## 2019-09-23 ENCOUNTER — Encounter: Payer: Self-pay | Admitting: Family Medicine

## 2019-09-23 ENCOUNTER — Ambulatory Visit (INDEPENDENT_AMBULATORY_CARE_PROVIDER_SITE_OTHER): Payer: BC Managed Care – PPO

## 2019-09-23 ENCOUNTER — Other Ambulatory Visit (INDEPENDENT_AMBULATORY_CARE_PROVIDER_SITE_OTHER): Payer: BC Managed Care – PPO

## 2019-09-23 ENCOUNTER — Ambulatory Visit: Payer: BC Managed Care – PPO | Admitting: Family Medicine

## 2019-09-23 VITALS — BP 112/76 | HR 78 | Ht 68.0 in | Wt 208.2 lb

## 2019-09-23 DIAGNOSIS — M25521 Pain in right elbow: Secondary | ICD-10-CM

## 2019-09-23 DIAGNOSIS — M7521 Bicipital tendinitis, right shoulder: Secondary | ICD-10-CM | POA: Insufficient documentation

## 2019-09-23 DIAGNOSIS — E875 Hyperkalemia: Secondary | ICD-10-CM

## 2019-09-23 MED ORDER — NITROGLYCERIN 0.2 MG/HR TD PT24: MEDICATED_PATCH | TRANSDERMAL | 1 refills | Status: DC

## 2019-09-23 NOTE — Patient Instructions (Addendum)
I think you have distal biceps tendonitis.  Work on the rehab.  Use nitropatches.  Recheck in 4-6 weeks.  Return sooner if needed.  If not making much progress let me know sooner via mychart and I will arrange for formal PT in [place].  Get xry on your way out today.   Nitroglycerin Protocol   Apply 1/4 nitroglycerin patch to affected area daily.  Change position of patch within the affected area every 24 hours.  You may experience a headache during the first 1-2 weeks of using the patch, these should subside.  If you experience headaches after beginning nitroglycerin patch treatment, you may take your preferred over the counter pain reliever.  Another side effect of the nitroglycerin patch is skin irritation or rash related to patch adhesive.  Please notify our office if you develop more severe headaches or rash, and stop the patch.  Tendon healing with nitroglycerin patch may require 12 to 24 weeks depending on the extent of injury.  Men should not use if taking Viagra, Cialis, or Levitra.   Do not use if you have migraines or rosacea.    Please perform the exercise program that we have prepared for you and gone over in detail on a daily basis.  In addition to the handout you were provided you can access your program through: [email or web address]   Your unique program code is: [email or web address]

## 2019-09-23 NOTE — Progress Notes (Signed)
Subjective:    I'm seeing this patient as a consultation for:  Dr. Caryl Bis  CC: R elbow pain  I, Wendy Poet, LAT, ATC, am serving as scribe for Dr. Lynne Leader.  HPI: Pt is a 53 y/o male presenting w/ c/o R elbow pain x 2 years.  His pain is located in the R antecubital fossa and he recalls no specific MOI.  He reports burning pain in his anterior elbow w/ occasional popping noted.  He tried a tennis elbow conuterforce strap which did not help all that much. He notes he predominately has pain in the anterior aspect of his elbow at the distal biceps tendon but also has some pain at the anterior aspect of the shoulder at the proximal biceps tendon insertion.   He saw his PCP on 09/17/19 and was told to ice his elbow and to avoid the elbow counterforce strap. Pt states that he isn't having pain currently but it does flare up intermittently.  He states that when it flares up it feels like a torch at his R antecubital fossa.  He also reports pain at the anterior shoulder in there area of the proximal biceps but this location can be independent from his elbow pain.  He does note some intermittent popping in his R elbow.  He takes 2 meloxicam daily for his lower back.  Past medical history, Surgical history, Family history not pertinant except as noted below, Social history, Allergies, and medications have been entered into the medical record, reviewed, and no changes needed.   Review of Systems: No headache, visual changes, nausea, vomiting, diarrhea, constipation, dizziness, abdominal pain, skin rash, fevers, chills, night sweats, weight loss, swollen lymph nodes, body aches, joint swelling, muscle aches, chest pain, shortness of breath, mood changes, visual or auditory hallucinations.   Objective:    Vitals:   09/23/19 1037  BP: 112/76  Pulse: 78  SpO2: 97%   General: Well Developed, well nourished, and in no acute distress.  Neuro/Psych: Alert and oriented x3, extra-ocular muscles intact,  able to move all 4 extremities, sensation grossly intact. Skin: Warm and dry, no rashes noted.  Respiratory: Not using accessory muscles, speaking in full sentences, trachea midline.  Cardiovascular: Pulses palpable, no extremity edema. Abdomen: Does not appear distended. MSK:  Right shoulder: Normal-appearing normal motion.  Mildly tender palpation at bicipital groove and proximal biceps tendon. Right elbow normal-appearing Normal motion with audible click with pronation supination with elbow flexed position. Tender palpation at the distal biceps tendon insertion onto the radius and anterior elbow. Pain is somewhat reproduced with resisted supination and flexion. Strength is intact.  Pulses cap refill and sensation are intact distally.  Lab and Radiology Results  X-ray images right elbow obtained today personally and independently reviewed No severe degenerative changes.  No acute fractures.  Normal-appearing x-ray Await formal radiology review  Limited musculoskeletal ultrasound right anterior elbow and anterior proximal shoulder Normal-appearing distal biceps tendon with no significant defect or tendon tear. Long head proximal biceps tendon normal-appearing intact at the bicipital groove. Normal bony structures.    Impression and Recommendations:    Assessment and Plan: 53 y.o. male with biceps tendinitis predominantly at the distal biceps tendon but also somewhat at the proximal biceps tendon long head. Discussed options.  Plan for home exercise program as taught by myself and ATC as well as nitroglycerin patch protocol.  Will check back in 4 to 6 weeks unless completely better.  If not improving rapidly patient will see me  a message and will likely will refer to formal physical therapy in [place] area.Marland Kitchen  PDMP not reviewed this encounter. Orders Placed This Encounter  Procedures  . [place] - Upper Extremity - Limited - RIGHT    Order Specific Question:   Reason for Exam  (SYMPTOM  OR DIAGNOSIS REQUIRED)    Answer:   R elbow pain    Order Specific Question:   Preferred imaging location?    Answer:   [facility]  . DG Elbow Complete Right    Standing Status:   Future    Number of Occurrences:   1    Standing Expiration Date:   11/22/2020    Order Specific Question:   Reason for Exam (SYMPTOM  OR DIAGNOSIS REQUIRED)    Answer:   eval pain right elbow with clicking    Order Specific Question:   Preferred imaging location?    Answer:   [facility]    Order Specific Question:   Radiology Contrast Protocol - do NOT remove file path    Answer:   [email or web address]   Meds ordered this encounter  Medications  . nitroGLYCERIN (NITRODUR - DOSED IN MG/24 HR) 0.2 mg/hr patch    Sig: Apply 1/4 patch daily to tendon for tendonitis.    Dispense:  30 patch    Refill:  1    Discussed warning signs or symptoms. Please see discharge instructions. Patient expresses understanding.  The above documentation has been reviewed and is accurate and complete Lynne Leader

## 2019-09-23 NOTE — Progress Notes (Signed)
X-ray elbow looks largely normal.  No fractures or significant arthritis.

## 2019-09-24 LAB — POTASSIUM: Potassium: 4.5 mEq/L (ref 3.5–5.1)

## 2019-10-13 ENCOUNTER — Encounter: Payer: Self-pay | Admitting: Family Medicine

## 2019-10-18 ENCOUNTER — Other Ambulatory Visit: Payer: Self-pay | Admitting: Family Medicine

## 2019-10-28 ENCOUNTER — Ambulatory Visit: Payer: BC Managed Care – PPO | Admitting: Family Medicine

## 2019-11-23 ENCOUNTER — Other Ambulatory Visit: Payer: Self-pay | Admitting: Family Medicine

## 2019-11-24 NOTE — Telephone Encounter (Signed)
Refilled: 10/19/2019 Last OV: 09/17/2019 Next OV: 12/22/2019

## 2019-12-07 ENCOUNTER — Other Ambulatory Visit: Payer: Self-pay | Admitting: Family Medicine

## 2019-12-09 ENCOUNTER — Other Ambulatory Visit: Payer: Self-pay | Admitting: Family Medicine

## 2019-12-13 ENCOUNTER — Other Ambulatory Visit: Payer: Self-pay | Admitting: Family Medicine

## 2019-12-19 MED ORDER — LISDEXAMFETAMINE DIMESYLATE 30 MG PO CAPS: 30.0000 mg | ORAL_CAPSULE | Freq: Every day | ORAL | 0 refills | Status: DC

## 2019-12-22 ENCOUNTER — Other Ambulatory Visit: Payer: Self-pay

## 2019-12-22 ENCOUNTER — Encounter: Payer: Self-pay | Admitting: Family Medicine

## 2019-12-22 ENCOUNTER — Ambulatory Visit (INDEPENDENT_AMBULATORY_CARE_PROVIDER_SITE_OTHER): Payer: BC Managed Care – PPO | Admitting: Family Medicine

## 2019-12-22 DIAGNOSIS — M7521 Bicipital tendinitis, right shoulder: Secondary | ICD-10-CM | POA: Diagnosis not present

## 2019-12-22 DIAGNOSIS — R55 Syncope and collapse: Secondary | ICD-10-CM | POA: Diagnosis not present

## 2019-12-22 DIAGNOSIS — I10 Essential (primary) hypertension: Secondary | ICD-10-CM

## 2019-12-22 DIAGNOSIS — F909 Attention-deficit hyperactivity disorder, unspecified type: Secondary | ICD-10-CM | POA: Diagnosis not present

## 2019-12-22 MED ORDER — LISDEXAMFETAMINE DIMESYLATE 30 MG PO CAPS: 30.0000 mg | ORAL_CAPSULE | Freq: Every day | ORAL | 0 refills | Status: DC
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
## 2019-12-22 NOTE — Assessment & Plan Note (Signed)
Improved.  He will monitor for any recurrence.

## 2019-12-22 NOTE — Assessment & Plan Note (Signed)
Description is consistent with a vasovagal episode.  Advised to work on methods of making his bowel movements easier to pass.  He will add fiber.  Discussed if he has a syncopal episode in the future he needs to be evaluated in person.

## 2019-12-22 NOTE — Assessment & Plan Note (Signed)
Well-controlled.  Continue current regimen.  Lab work up-to-date. 

## 2019-12-22 NOTE — Assessment & Plan Note (Signed)
Stable.  Continue Vyvanse.  Refill sent to pharmacy.

## 2019-12-23 ENCOUNTER — Telehealth: Payer: Self-pay | Admitting: Family Medicine

## 2019-12-23 NOTE — Telephone Encounter (Signed)
LVM for pt to set up 3m follow up 

## 2020-01-02 ENCOUNTER — Other Ambulatory Visit: Payer: Self-pay | Admitting: Family Medicine

## 2020-01-04 ENCOUNTER — Other Ambulatory Visit: Payer: Self-pay | Admitting: Family Medicine

## 2020-01-05 NOTE — Telephone Encounter (Signed)
Refill request for tramadol, last seen 12-22-19, last filled 12-25-19.  Please advise.

## 2020-01-06 MED ORDER — TRAMADOL HCL 50 MG PO TABS: 50.0000 mg | ORAL_TABLET | Freq: Every day | ORAL | 0 refills | Status: DC | PRN

## 2020-02-10 ENCOUNTER — Other Ambulatory Visit: Payer: Self-pay | Admitting: Family Medicine

## 2020-02-10 IMAGING — DX DG ELBOW COMPLETE 3+V*R*
4 series · 4 of 4 positions shown · non-contrast
Comparison: None.

CLINICAL DATA: Evaluate pain and clicking

EXAM:
RIGHT ELBOW - COMPLETE 3+ VIEW

[elbow ap]
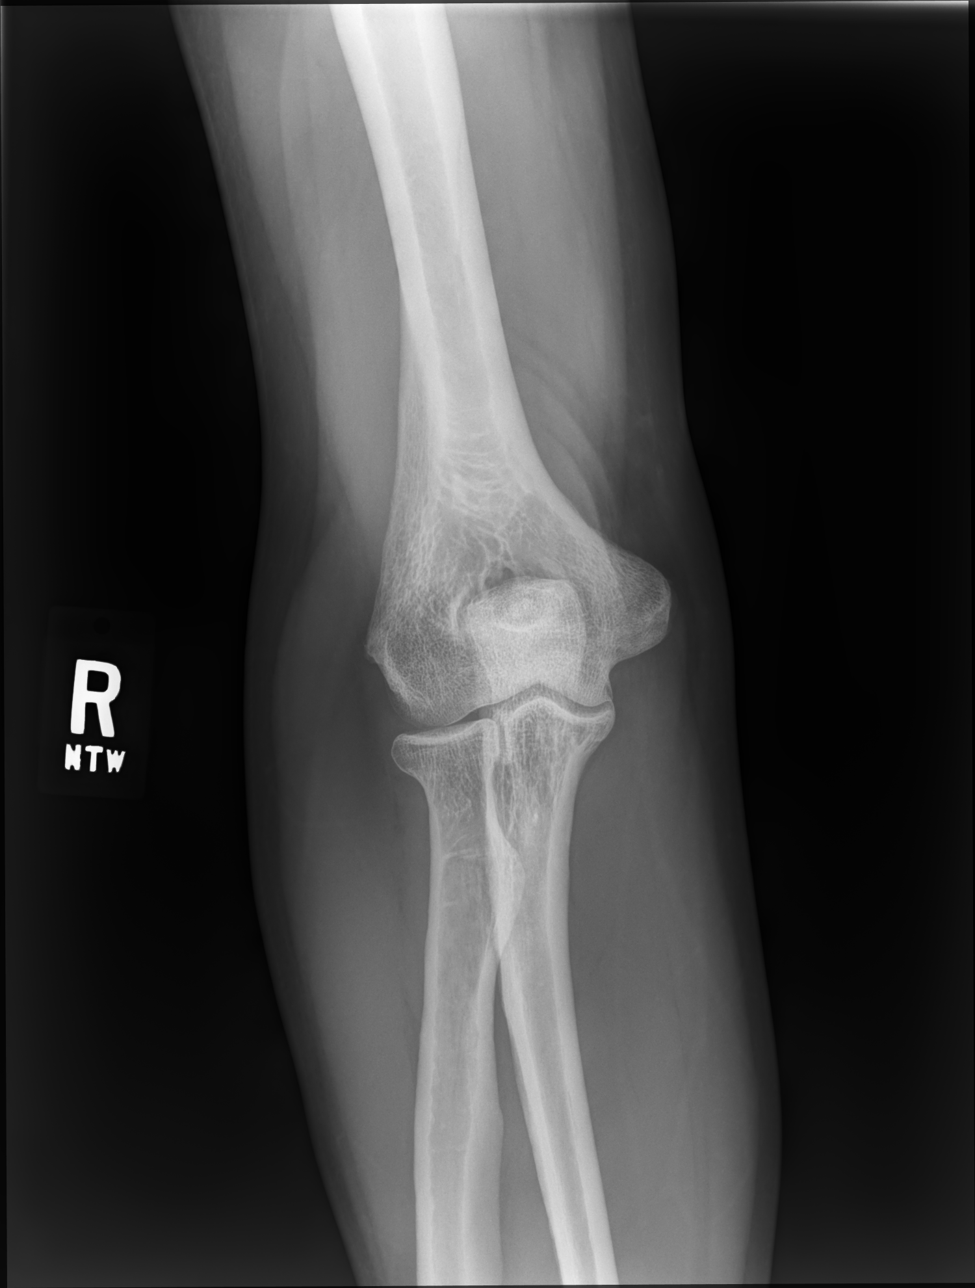

[elbow medial oblique]
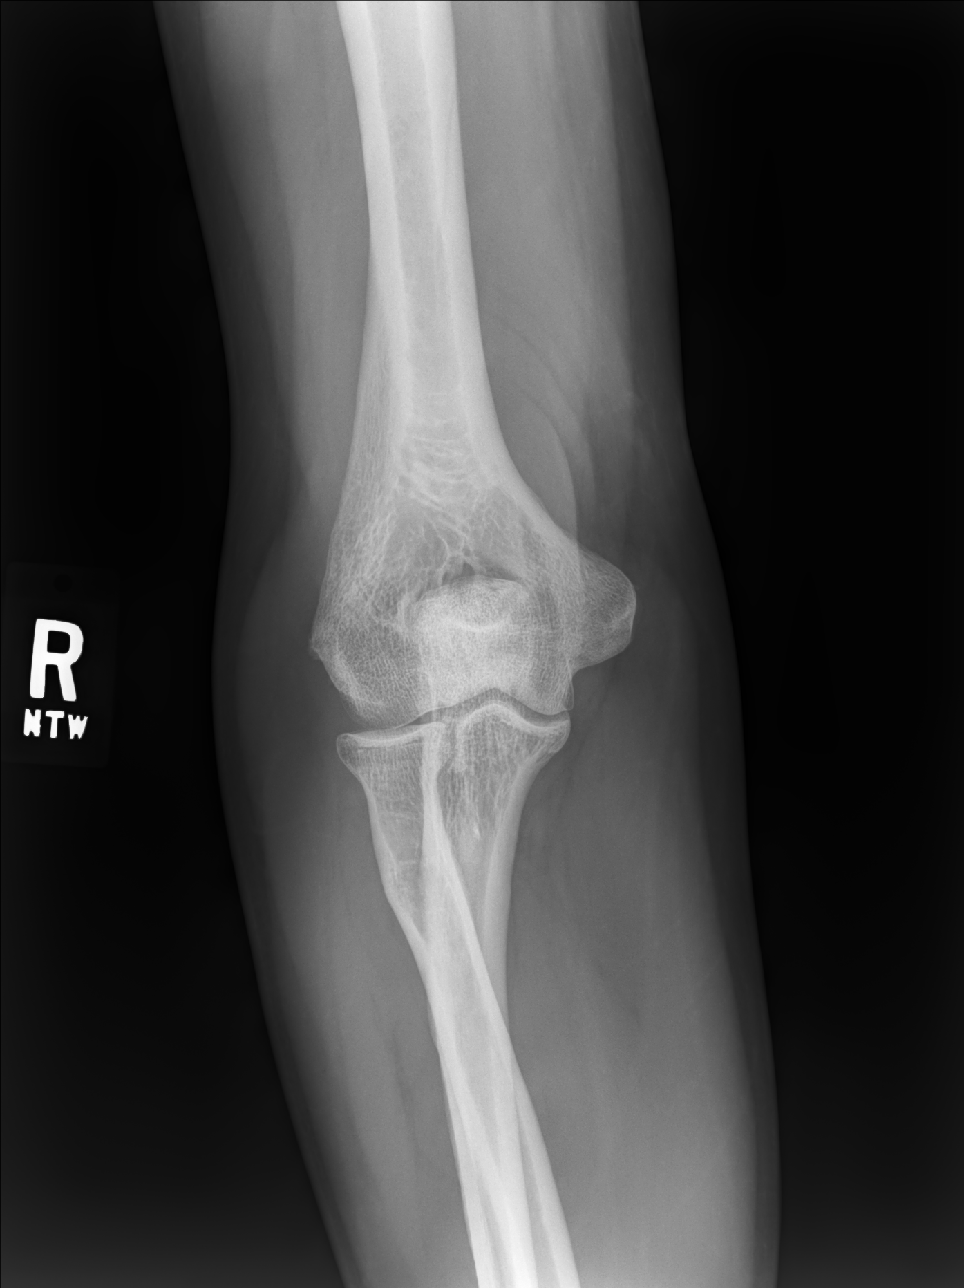

[elbow lateral oblique]
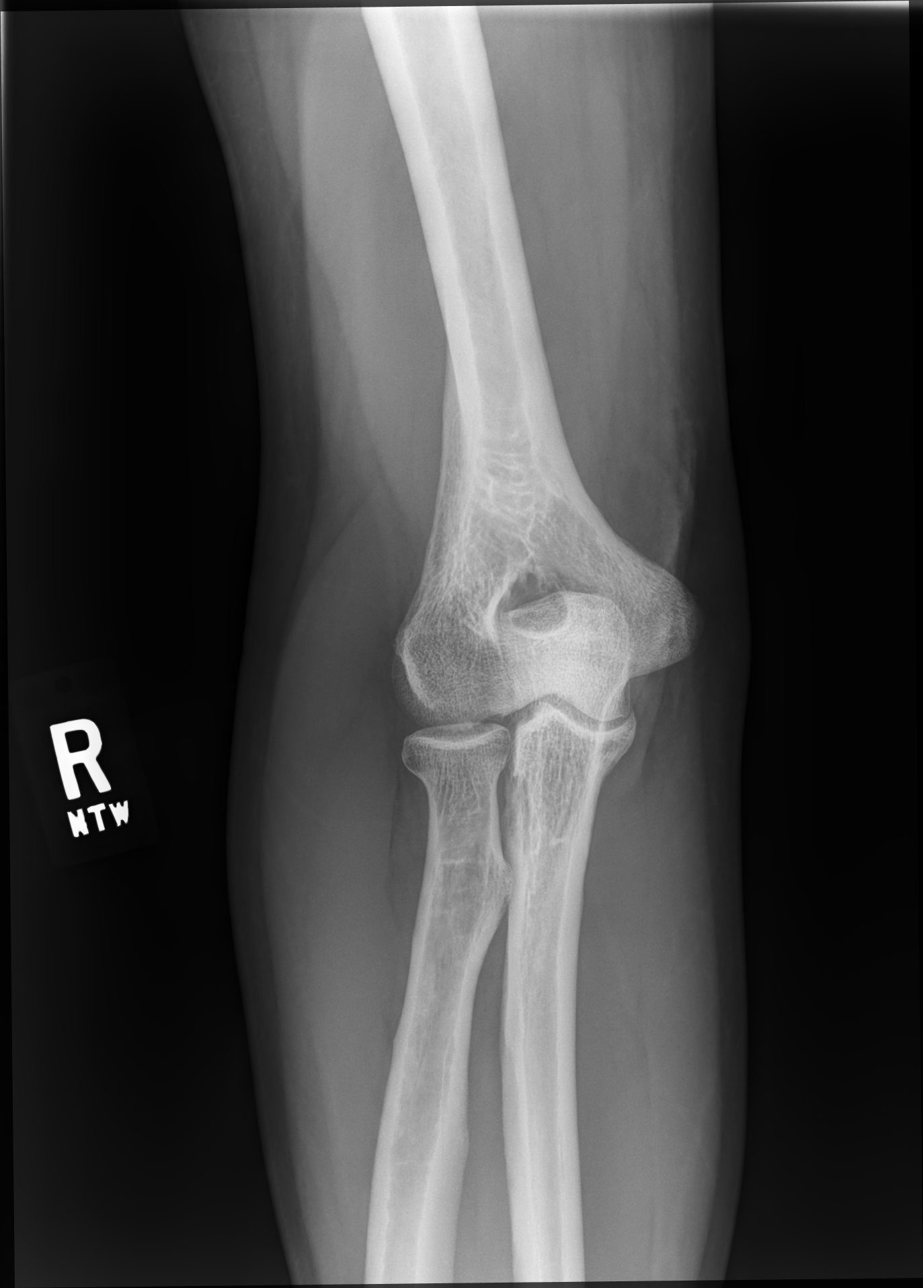

[elbow lat]
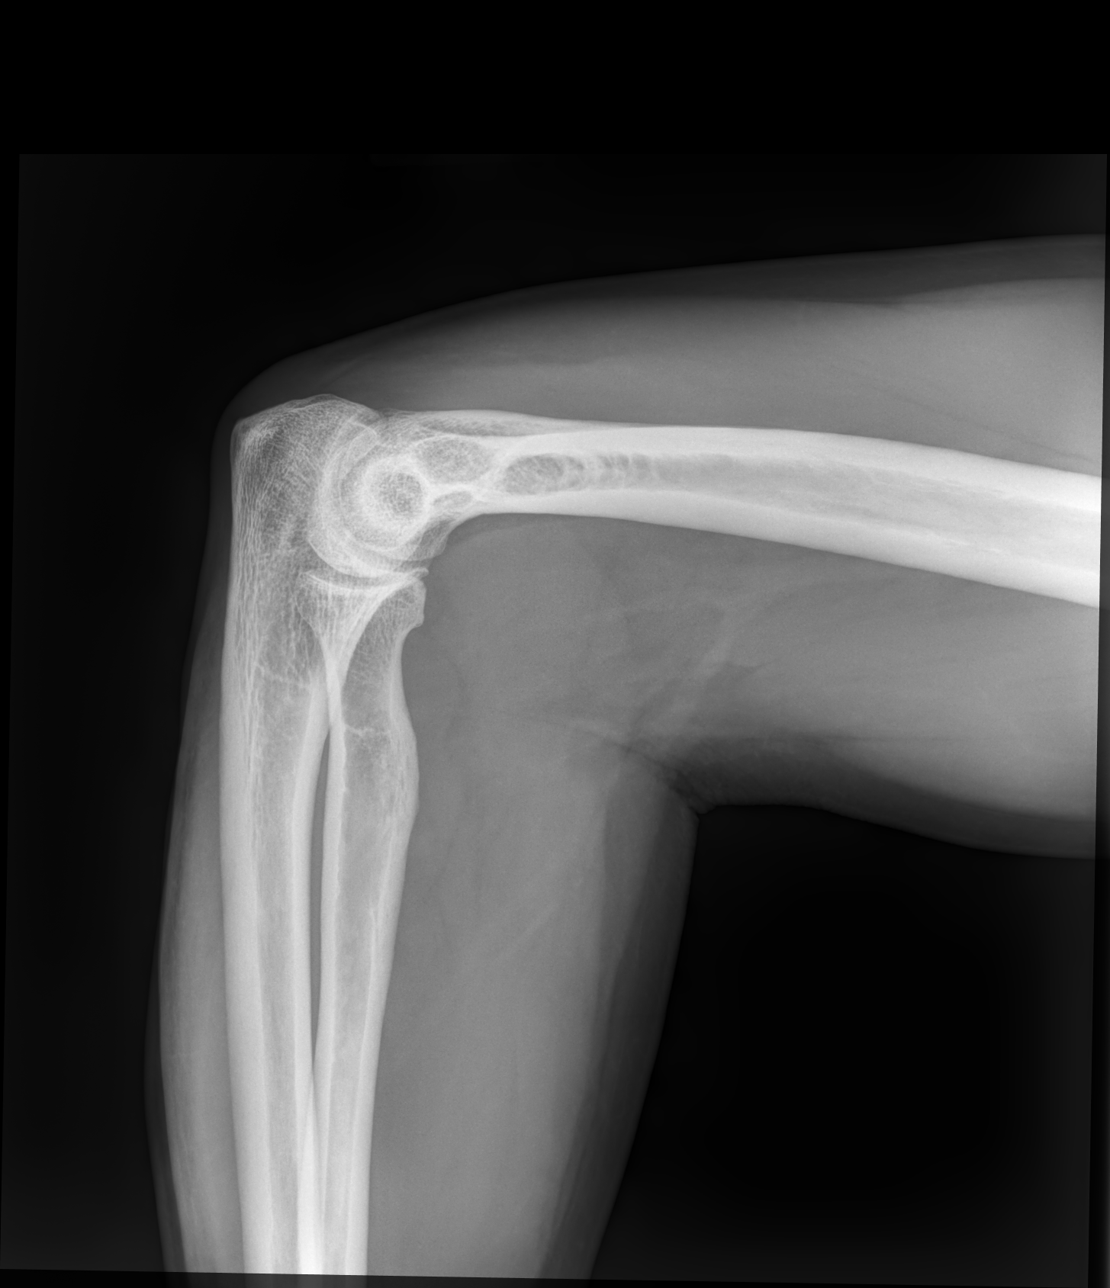

[4 of 4 positions shown; findings below may reference images not displayed]

FINDINGS: No fracture or dislocation of the right elbow. Joint spaces are well
preserved. No elbow joint effusion. Soft tissues are unremarkable.
IMPRESSION: No fracture or dislocation of the right elbow. Joint spaces are well
preserved. No elbow joint effusion.

## 2020-02-10 NOTE — Telephone Encounter (Signed)
Refill request for tramadol, last seen 12-22-19, last filled 01-06-20.  Please advise.

## 2020-03-12 ENCOUNTER — Other Ambulatory Visit: Payer: Self-pay | Admitting: Family Medicine

## 2020-03-22 ENCOUNTER — Other Ambulatory Visit: Payer: Self-pay | Admitting: Family Medicine

## 2020-03-22 NOTE — Telephone Encounter (Signed)
Refill request for tramadol, last seen 12-22-19, last filled 02-10-20.  Please advise.

## 2020-03-27 ENCOUNTER — Other Ambulatory Visit: Payer: Self-pay | Admitting: Family Medicine

## 2020-03-27 NOTE — Telephone Encounter (Signed)
The patient needs follow-up scheduled and then I will refill to cover until his follow-up visit.

## 2020-03-27 NOTE — Telephone Encounter (Signed)
Refill request for VYVANSE, last seen 12-22-19, last filled 02-16-20.  Please advise.

## 2020-03-27 NOTE — Telephone Encounter (Signed)
Mychart message was sent to inform the patient of the below

## 2020-03-28 MED ORDER — LISDEXAMFETAMINE DIMESYLATE 30 MG PO CAPS: 30.0000 mg | ORAL_CAPSULE | Freq: Every day | ORAL | 0 refills | Status: DC

## 2020-03-28 NOTE — Telephone Encounter (Signed)
Has appointment 04-12-20

## 2020-04-12 ENCOUNTER — Encounter: Payer: Self-pay | Admitting: Family Medicine

## 2020-04-12 ENCOUNTER — Ambulatory Visit (INDEPENDENT_AMBULATORY_CARE_PROVIDER_SITE_OTHER): Payer: BC Managed Care – PPO | Admitting: Family Medicine

## 2020-04-12 ENCOUNTER — Other Ambulatory Visit: Payer: Self-pay

## 2020-04-12 VITALS — BP 130/80 | HR 63 | Temp 96.4°F | Ht 68.0 in | Wt 204.2 lb

## 2020-04-12 DIAGNOSIS — Z125 Encounter for screening for malignant neoplasm of prostate: Secondary | ICD-10-CM | POA: Diagnosis not present

## 2020-04-12 DIAGNOSIS — Z0000 Encounter for general adult medical examination without abnormal findings: Secondary | ICD-10-CM

## 2020-04-12 DIAGNOSIS — E669 Obesity, unspecified: Secondary | ICD-10-CM | POA: Diagnosis not present

## 2020-04-12 NOTE — Progress Notes (Signed)
Tommi Rumps, MD Phone: [phone]  Austin [name] is a 54 y.o. male who presents today for CPE.  Diet: Does eat fried food though they are working on cutting down and eating more grilled foods.  Drinking more water.  2 to 3 glasses of sweet tea at night. Exercise: Does yard work and full work daily. Colonoscopy: 08/28/2016.  5-year recall. Prostate cancer screening: Due. Family history-  Prostate cancer: No.  Colon cancer: No. Vaccines-   Flu: Out of season.  Tetanus: Declines  Shingles: Declines  COVID19: Declines HIV screening: Notes he had this 23 years ago. Tobacco use: No Alcohol use: 1 shot per night Illicit Drug use: No Dentist: Yes Ophthalmology: Yes   Active Ambulatory Problems    Diagnosis Date Noted  . Routine general medical examination at a health care facility 08/05/2013  . GERD (gastroesophageal reflux disease) 08/05/2013  . Constipation 08/05/2013  . Nonspecific abnormal electrocardiogram (ECG) (EKG) 08/05/2013  . ED (erectile dysfunction) 08/05/2013  . Obesity (BMI 30-39.9) 08/30/2014  . ADHD 03/20/2015  . Skin lesion 03/20/2015  . Anxiety state 07/24/2015  . Screening for colon cancer 04/15/2016  . Chronic neck pain 06/14/2016  . Hypertension 01/06/2017  . Thyroid nodule 01/06/2017  . Dyspnea on exertion 08/04/2018  . History of bright red blood per rectum 04/16/2019  . Right elbow pain 09/17/2019  . Biceps tendinitis of right upper extremity 09/23/2019  . Vasovagal episode 12/22/2019   Resolved Ambulatory Problems    Diagnosis Date Noted  . Chest pain 08/05/2013  . Aphthous ulcer 09/08/2013  . Viral gastroenteritis 01/12/2014  . Abdominal pain 03/20/2015  . Pain and swelling of right lower leg 11/03/2017  . Respiratory illness 09/23/2018   Past Medical History:  Diagnosis Date  . Arthritis   . Frequent headaches     Family History  Problem Relation Age of Onset  . Arthritis Mother        RA  . Hypertension Sister   . Migraines  Sister   . Colon cancer Neg Hx     Social History   Socioeconomic History  . Marital status: Married    Spouse name: Otila Kluver  . Number of children: 2  . Years of education: 107  . Highest education level: Not on file  Occupational History  . Occupation: [place]: [employer]    Comment: Chief Strategy Officer for [employer]  . Smoking status: Former Smoker    Packs/day: 1.00    Years: 30.00    Pack years: 30.00    Quit date: 01/10/2012    Years since quitting: 8.2  . Smokeless tobacco: Never Used  Substance and Sexual Activity  . Alcohol use: Yes    Alcohol/week: 7.0 standard drinks    Types: 7 Shots of liquor per week    Comment: one drink per night per pt  . Drug use: No    Types: Cocaine, Other-see comments    Comment: acid; past , "nothing for the past 4 years per pt"  . Sexual activity: Not on file  Other Topics Concern  . Not on file  Social History Narrative   Lives in [place] with wife, Otila Kluver.      He has 2 children, a daughter and son. He has 2 step children. He works at [employer] for the last 5 years.      He enjoys going to car shows with his sons. Also participates in soccer with his son.   Social  Determinants of Health   Financial Resource Strain:   . Difficulty of Paying Living Expenses:   Food Insecurity:   . Worried About Charity fundraiser in the Last Year:   . Arboriculturist in the Last Year:   Transportation Needs:   . Film/video editor (Medical):   Marland Kitchen Lack of Transportation (Non-Medical):   Physical Activity:   . Days of Exercise per Week:   . Minutes of Exercise per Session:   Stress:   . Feeling of Stress :   Social Connections:   . Frequency of Communication with Friends and Family:   . Frequency of Social Gatherings with Friends and Family:   . Attends Religious Services:   . Active Member of Clubs or Organizations:   . Attends Archivist Meetings:   Marland Kitchen Marital Status:   Intimate Partner Violence:     . Fear of Current or Ex-Partner:   . Emotionally Abused:   Marland Kitchen Physically Abused:   . Sexually Abused:     ROS  General:  Negative for nexplained weight loss, fever Skin: Negative for new or changing mole, sore that won't heal HEENT: Negative for trouble hearing, trouble seeing, ringing in ears, mouth sores, hoarseness, change in voice, dysphagia. CV:  Negative for chest pain, dyspnea, edema, palpitations Resp: Negative for cough, dyspnea, hemoptysis GI: Negative for nausea, vomiting, diarrhea, constipation, abdominal pain, melena, hematochezia. GU: Negative for dysuria, incontinence, urinary hesitance, hematuria, vaginal or penile discharge, polyuria, sexual difficulty, lumps in testicle or breasts MSK: Negative for muscle cramps or aches, joint pain or swelling Neuro: Negative for headaches, weakness, numbness, dizziness, passing out/fainting Psych: Negative for depression, anxiety, memory problems  Objective  Physical Exam Vitals:   04/12/20 1532  BP: 130/80  Pulse: 63  Temp: (!) 96.4 F (35.8 C)  SpO2: 98%    BP Readings from Last 3 Encounters:  04/12/20 130/80  09/23/19 112/76  09/17/19 110/80   Wt Readings from Last 3 Encounters:  04/12/20 204 lb 3.2 oz (92.6 kg)  12/22/19 207 lb (93.9 kg)  09/23/19 208 lb 3.2 oz (94.4 kg)    Physical Exam Constitutional:      General: He is not in acute distress.    Appearance: He is not diaphoretic.  Eyes:     Conjunctiva/sclera: Conjunctivae normal.     Pupils: Pupils are equal, round, and reactive to light.  Cardiovascular:     Rate and Rhythm: Normal rate and regular rhythm.     Heart sounds: Normal heart sounds.  Pulmonary:     Effort: Pulmonary effort is normal.     Breath sounds: Normal breath sounds.  Abdominal:     General: Bowel sounds are normal. There is no distension.     Palpations: Abdomen is soft.     Tenderness: There is no abdominal tenderness. There is no guarding or rebound.  Musculoskeletal:      Right lower leg: No edema.     Left lower leg: No edema.  Lymphadenopathy:     Cervical: No cervical adenopathy.  Skin:    General: Skin is warm and dry.  Neurological:     Mental Status: He is alert.  Psychiatric:        Mood and Affect: Mood normal.      Assessment/Plan:   Routine general medical examination at a health care facility Physical exam completed.  Discussed healthy diet and decreasing fried foods as well as sweet tea.  Encouraged continued activity.  PSA  for prostate cancer screening.  Colonoscopy up-to-date.  He defers tetanus vaccine, Covid vaccine, and shingles vaccine.  Discussed not increasing alcohol intake any further.  Lab work as outlined below.   Orders Placed This Encounter  Procedures  . PSA  . Comp Met (CMET)  . Lipid panel  . HgB A1c    No orders of the defined types were placed in this encounter.   This visit occurred during the SARS-CoV-2 public health emergency.  Safety protocols were in place, including screening questions prior to the visit, additional usage of staff PPE, and extensive cleaning of exam room while observing appropriate contact time as indicated for disinfecting solutions.    Tommi Rumps, MD [place]

## 2020-04-12 NOTE — Patient Instructions (Signed)
Nice to see you. Please work on decreasing your fried food intake and sweet tea intake. Please continue physical activity. We will get labs and contact you with the results.

## 2020-04-12 NOTE — Assessment & Plan Note (Signed)
Physical exam completed.  Discussed healthy diet and decreasing fried foods as well as sweet tea.  Encouraged continued activity.  PSA for prostate cancer screening.  Colonoscopy up-to-date.  He defers tetanus vaccine, Covid vaccine, and shingles vaccine.  Discussed not increasing alcohol intake any further.  Lab work as outlined below.

## 2020-04-13 LAB — COMPREHENSIVE METABOLIC PANEL
ALT: 17 IU/L (ref 0–44)
AST: 22 IU/L (ref 0–40)
Albumin/Globulin Ratio: 2.2 (ref 1.2–2.2)
Albumin: 4.6 g/dL (ref 3.8–4.9)
Alkaline Phosphatase: 88 IU/L (ref 48–121)
BUN/Creatinine Ratio: 14 (ref 9–20)
BUN: 12 mg/dL (ref 6–24)
Bilirubin Total: 0.3 mg/dL (ref 0.0–1.2)
CO2: 27 mmol/L (ref 20–29)
Calcium: 9.6 mg/dL (ref 8.7–10.2)
Chloride: 101 mmol/L (ref 96–106)
Creatinine, Ser: 0.87 mg/dL (ref 0.76–1.27)
GFR calc Af Amer: 114 mL/min/{1.73_m2} (ref >59)
GFR calc non Af Amer: 99 mL/min/{1.73_m2} (ref >59)
Globulin, Total: 2.1 g/dL (ref 1.5–4.5)
Glucose: 81 mg/dL (ref 65–99)
Potassium: 4.6 mmol/L (ref 3.5–5.2)
Sodium: 138 mmol/L (ref 134–144)
Total Protein: 6.7 g/dL (ref 6.0–8.5)

## 2020-04-13 LAB — LIPID PANEL
Chol/HDL Ratio: 3.3 ratio (ref 0.0–5.0)
Cholesterol, Total: 183 mg/dL (ref 100–199)
HDL: 55 mg/dL (ref >39)
LDL Chol Calc (NIH): 104 mg/dL — ABNORMAL HIGH (ref 0–99)
Triglycerides: 134 mg/dL (ref 0–149)
VLDL Cholesterol Cal: 24 mg/dL (ref 5–40)

## 2020-04-13 LAB — HEMOGLOBIN A1C
Est. average glucose Bld gHb Est-mCnc: 105 mg/dL
Hgb A1c MFr Bld: 5.3 % (ref 4.8–5.6)

## 2020-04-13 LAB — PSA: Prostate Specific Ag, Serum: 0.3 ng/mL (ref 0.0–4.0)

## 2020-04-23 ENCOUNTER — Other Ambulatory Visit: Payer: Self-pay | Admitting: Family Medicine

## 2020-04-24 NOTE — Telephone Encounter (Signed)
Refill request for tramadol, last seen 04-12-20, last filled 03-22-20.  Please advise.

## 2020-05-30 ENCOUNTER — Other Ambulatory Visit: Payer: Self-pay | Admitting: Family Medicine

## 2020-06-08 ENCOUNTER — Other Ambulatory Visit: Payer: Self-pay | Admitting: Family Medicine

## 2020-06-10 ENCOUNTER — Other Ambulatory Visit: Payer: Self-pay | Admitting: Family Medicine

## 2020-07-03 ENCOUNTER — Other Ambulatory Visit: Payer: Self-pay | Admitting: Family Medicine

## 2020-07-10 ENCOUNTER — Other Ambulatory Visit: Payer: Self-pay | Admitting: Family Medicine

## 2020-07-10 MED ORDER — LISDEXAMFETAMINE DIMESYLATE 30 MG PO CAPS
30.0000 mg | ORAL_CAPSULE | Freq: Every day | ORAL | 0 refills | Status: DC
Start: 2020-07-10 — End: 2020-09-08

## 2020-07-11 ENCOUNTER — Encounter: Payer: Self-pay | Admitting: Family Medicine

## 2020-08-10 ENCOUNTER — Other Ambulatory Visit: Payer: Self-pay | Admitting: Internal Medicine

## 2020-08-10 ENCOUNTER — Other Ambulatory Visit: Payer: Self-pay | Admitting: Family Medicine

## 2020-09-05 ENCOUNTER — Encounter: Payer: Self-pay | Admitting: Family Medicine

## 2020-09-08 ENCOUNTER — Other Ambulatory Visit: Payer: Self-pay

## 2020-09-08 ENCOUNTER — Ambulatory Visit (INDEPENDENT_AMBULATORY_CARE_PROVIDER_SITE_OTHER): Payer: BC Managed Care – PPO | Admitting: Family Medicine

## 2020-09-08 ENCOUNTER — Encounter: Payer: Self-pay | Admitting: Family Medicine

## 2020-09-08 DIAGNOSIS — I10 Essential (primary) hypertension: Secondary | ICD-10-CM | POA: Diagnosis not present

## 2020-09-08 DIAGNOSIS — G8929 Other chronic pain: Secondary | ICD-10-CM | POA: Diagnosis not present

## 2020-09-08 DIAGNOSIS — M542 Cervicalgia: Secondary | ICD-10-CM | POA: Diagnosis not present

## 2020-09-08 DIAGNOSIS — F909 Attention-deficit hyperactivity disorder, unspecified type: Secondary | ICD-10-CM

## 2020-09-08 MED ORDER — LISDEXAMFETAMINE DIMESYLATE 30 MG PO CAPS: 30.0000 mg | ORAL_CAPSULE | Freq: Every day | ORAL | 0 refills | Status: DC

## 2020-09-08 MED ORDER — TRAMADOL HCL 50 MG PO TABS: 50.0000 mg | ORAL_TABLET | Freq: Every day | ORAL | 0 refills | Status: DC | PRN

## 2020-09-08 MED ORDER — CARVEDILOL 12.5 MG PO TABS: 12.5000 mg | ORAL_TABLET | Freq: Two times a day (BID) | ORAL | 1 refills | Status: DC

## 2020-09-08 NOTE — Assessment & Plan Note (Signed)
Slightly above goal.  Discussed goal of less than 130/80.  We will increase his carvedilol to 12.5 mg twice daily.  He will continue lisinopril 40 mg once daily.

## 2020-09-08 NOTE — Assessment & Plan Note (Signed)
Stable.  We will refill Vyvanse.  Controlled substance database reviewed.  Continue Vyvanse 30 mg once daily.

## 2020-09-08 NOTE — Assessment & Plan Note (Signed)
This is stable.  He can continue tramadol 50 mg once nightly.  Discussed monitoring for drowsiness.

## 2020-09-08 NOTE — Progress Notes (Signed)
  Tommi Rumps, MD Phone: [phone]  Austin [name] is a 54 y.o. male who presents today for f/u.  ADHD Medication: vyvanse Effectiveness: yes Palpitations: no Sleep difficulty: no Appetite suppression: no  HYPERTENSION  Disease Monitoring  Home BP Monitoring 120s-130/80s Chest pain- no    Dyspnea- no Medications  Compliance-  Taking coreg, lisinopril.   Edema- no Patient has altered his diet and is eating mostly grilled chicken and vegetables.  He does yard work.  Significant exercise is limited by chronic neck issues.  Chronic neck pain: Notes this is stable.  Well-controlled with meloxicam 7.5 mg twice daily and tramadol once nightly.  No drowsiness with the tramadol.     Social History   Tobacco Use  Smoking Status Former Smoker  . Packs/day: 1.00  . Years: 30.00  . Pack years: 30.00  . Quit date: 01/10/2012  . Years since quitting: 8.6  Smokeless Tobacco Never Used     ROS see history of present illness  Objective  Physical Exam Vitals:   09/08/20 1100  BP: 120/80  Pulse: 91  Temp: 97.8 F (36.6 C)  SpO2: 97%    BP Readings from Last 3 Encounters:  09/08/20 120/80  04/12/20 130/80  09/23/19 112/76   Wt Readings from Last 3 Encounters:  09/08/20 206 lb 3.2 oz (93.5 kg)  04/12/20 204 lb 3.2 oz (92.6 kg)  12/22/19 207 lb (93.9 kg)    Physical Exam Constitutional:      General: He is not in acute distress.    Appearance: He is not diaphoretic.  Cardiovascular:     Rate and Rhythm: Normal rate and regular rhythm.     Heart sounds: Normal heart sounds.  Pulmonary:     Effort: Pulmonary effort is normal.     Breath sounds: Normal breath sounds.  Musculoskeletal:     Right lower leg: No edema.     Left lower leg: No edema.  Skin:    General: Skin is warm and dry.  Neurological:     Mental Status: He is alert.      Assessment/Plan: Please see individual problem list.  Problem List Items Addressed This Visit    ADHD    Stable.  We  will refill Vyvanse.  Controlled substance database reviewed.  Continue Vyvanse 30 mg once daily.      Relevant Medications   lisdexamfetamine (VYVANSE) 30 MG capsule (Start on 11/08/2020)   lisdexamfetamine (VYVANSE) 30 MG capsule (Start on 10/09/2020)   lisdexamfetamine (VYVANSE) 30 MG capsule   Chronic neck pain    This is stable.  He can continue tramadol 50 mg once nightly.  Discussed monitoring for drowsiness.      Relevant Medications   traMADol (ULTRAM) 50 MG tablet   Hypertension    Slightly above goal.  Discussed goal of less than 130/80.  We will increase his carvedilol to 12.5 mg twice daily.  He will continue lisinopril 40 mg once daily.      Relevant Medications   carvedilol (COREG) 12.5 MG tablet       Health Maintenance: declines flu vaccine.    This visit occurred during the SARS-CoV-2 public health emergency.  Safety protocols were in place, including screening questions prior to the visit, additional usage of staff PPE, and extensive cleaning of exam room while observing appropriate contact time as indicated for disinfecting solutions.    Tommi Rumps, MD [place]

## 2020-09-08 NOTE — Patient Instructions (Signed)
Nice to see you. We will increase your carvedilol to 12.5 mg twice daily.  Please monitor for any lightheadedness or heart rates less than 60.  Please let us know if these things occur.

## 2020-09-11 ENCOUNTER — Telehealth (INDEPENDENT_AMBULATORY_CARE_PROVIDER_SITE_OTHER): Payer: BC Managed Care – PPO | Admitting: Nurse Practitioner

## 2020-09-11 ENCOUNTER — Encounter: Payer: Self-pay | Admitting: Nurse Practitioner

## 2020-09-11 VITALS — HR 100 | Temp 99.7°F | Resp 17

## 2020-09-11 DIAGNOSIS — J069 Acute upper respiratory infection, unspecified: Secondary | ICD-10-CM | POA: Diagnosis not present

## 2020-09-11 DIAGNOSIS — U071 COVID-19: Secondary | ICD-10-CM

## 2020-09-11 DIAGNOSIS — J029 Acute pharyngitis, unspecified: Secondary | ICD-10-CM | POA: Diagnosis not present

## 2020-09-11 DIAGNOSIS — Z20822 Contact with and (suspected) exposure to covid-19: Secondary | ICD-10-CM | POA: Insufficient documentation

## 2020-09-11 LAB — POCT INFLUENZA A/B
Influenza A, POC: NEGATIVE
Influenza B, POC: NEGATIVE

## 2020-09-11 LAB — POCT RAPID STREP A (OFFICE): Rapid Strep A Screen: NEGATIVE

## 2020-09-11 MED ORDER — DEXTROMETHORPHAN-GUAIFENESIN 5-100 MG/5ML PO LIQD: 10.0000 mL | Freq: Every evening | ORAL | 0 refills | Status: AC | PRN

## 2020-09-11 MED ORDER — BENZONATATE 100 MG PO CAPS: 200.0000 mg | ORAL_CAPSULE | Freq: Three times a day (TID) | ORAL | 0 refills | Status: AC | PRN

## 2020-09-11 NOTE — Patient Instructions (Addendum)
Please go to the drive through behind the clinic to get a Covid, flu and Strep tests done.   Advised patient on supportive measures:  Get rest, drink plenty of fluids, and use tylenol or ibuprofen as needed for pain.  Follow up  if symptoms worsen or if symptoms are not improved in 2-3 days.  If symptoms worsen, please go to [facility] or [facility]  for in-person evaluation.  While you are waiting for your Covid to results, please stay home and do not have outside guests in the house until you get the negative results and your viral symptoms have improved.   I have ordered Tessalon Perles for cough x3 per day as needed and Mucinex DM for cough at night.        Upper Respiratory Infection, Adult An upper respiratory infection (URI) is a common viral infection of the nose, throat, and upper air passages that lead to the lungs. The most common type of URI is the common cold. URIs usually get better on their own, without medical treatment. What are the causes? A URI is caused by a virus. You may catch a virus by:  Breathing in droplets from an infected person's cough or sneeze.  Touching something that has been exposed to the virus (contaminated) and then touching your mouth, nose, or eyes. What increases the risk? You are more likely to get a URI if:  You are very young or very old.  It is autumn or winter.  You have close contact with others, such as at a daycare, school, or health care facility.  You smoke.  You have long-term (chronic) heart or lung disease.  You have a weakened disease-fighting (immune) system.  You have nasal allergies or asthma.  You are experiencing a lot of stress.  You work in an area that has poor air circulation.  You have poor nutrition. What are the signs or symptoms? A URI usually involves some of the following symptoms:  Runny or stuffy (congested) nose.  Sneezing.  Cough.  Sore  throat.  Headache.  Fatigue.  Fever.  Loss of appetite.  Pain in your forehead, behind your eyes, and over your cheekbones (sinus pain).  Muscle aches.  Redness or irritation of the eyes.  Pressure in the ears or face. How is this diagnosed? This condition may be diagnosed based on your medical history and symptoms, and a physical exam. Your health care provider may use a cotton swab to take a mucus sample from your nose (nasal swab). This sample can be tested to determine what virus is causing the illness. How is this treated? URIs usually get better on their own within 7-10 days. You can take steps at home to relieve your symptoms. Medicines cannot cure URIs, but your health care provider may recommend certain medicines to help relieve symptoms, such as:  Over-the-counter cold medicines.  Cough suppressants. Coughing is a type of defense against infection that helps to clear the respiratory system, so take these medicines only as recommended by your health care provider.  Fever-reducing medicines. Follow these instructions at home: Activity  Rest as needed.  If you have a fever, stay home from work or school until your fever is gone or until your health care provider says you are no longer contagious. Your health care provider may have you wear a face mask to prevent your infection from spreading. Relieving symptoms  Gargle with a salt-water mixture 3-4 times a day or as needed.  To make a salt-water mixture, completely dissolve -1 tsp of salt in 1 cup of warm water.  Use a cool-mist humidifier to add moisture to the air. This can help you breathe more easily. Eating and drinking   Drink enough fluid to keep your urine pale yellow.  Eat soups and other clear broths. General instructions   Take over-the-counter and prescription medicines only as told by your health care provider. These include cold medicines, fever reducers, and cough suppressants.  Do not use any  products that contain nicotine or tobacco, such as cigarettes and e-cigarettes. If you need help quitting, ask your health care provider.  Stay away from secondhand smoke.  Stay up to date on all immunizations, including the yearly (annual) flu vaccine.  Keep all follow-up visits as told by your health care provider. This is important. How to prevent the spread of infection to others   URIs can be passed from person to person (are contagious). To prevent the infection from spreading: ? Wash your hands often with soap and water. If soap and water are not available, use hand sanitizer. ? Avoid touching your mouth, face, eyes, or nose. ? Cough or sneeze into a tissue or your sleeve or elbow instead of into your hand or into the air. Contact a health care provider if:  You are getting worse instead of better.  You have a fever or chills.  Your mucus is brown or red.  You have yellow or brown discharge coming from your nose.  You have pain in your face, especially when you bend forward.  You have swollen neck glands.  You have pain while swallowing.  You have white areas in the back of your throat. Get help right away if:  You have shortness of breath that gets worse.  You have severe or persistent: ? Headache. ? Ear pain. ? Sinus pain. ? Chest pain.  You have chronic lung disease along with any of the following: ? Wheezing. ? Prolonged cough. ? Coughing up blood. ? A change in your usual mucus.  You have a stiff neck.  You have changes in your: ? Vision. ? Hearing. ? Thinking. ? Mood. Summary  An upper respiratory infection (URI) is a common infection of the nose, throat, and upper air passages that lead to the lungs.  A URI is caused by a virus.  URIs usually get better on their own within 7-10 days.  Medicines cannot cure URIs, but your health care provider may recommend certain medicines to help relieve symptoms. This information is not intended to  replace advice given to you by your health care provider. Make sure you discuss any questions you have with your health care provider. Document Revised: 11/05/2018 Document Reviewed: 06/13/2017 Elsevier Patient Education  [place].
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
## 2020-09-12 ENCOUNTER — Encounter: Payer: Self-pay | Admitting: Nurse Practitioner

## 2020-09-12 DIAGNOSIS — U071 COVID-19: Secondary | ICD-10-CM | POA: Insufficient documentation

## 2020-09-12 HISTORY — DX: COVID-19: U07.1

## 2020-09-12 LAB — NOVEL CORONAVIRUS, NAA: SARS-CoV-2, NAA: DETECTED — AB

## 2020-09-12 LAB — SARS-COV-2, NAA 2 DAY TAT

## 2020-09-12 LAB — SPECIMEN STATUS REPORT

## 2020-09-13 ENCOUNTER — Other Ambulatory Visit (HOSPITAL_COMMUNITY): Payer: Self-pay | Admitting: Family

## 2020-09-13 ENCOUNTER — Telehealth: Payer: Self-pay | Admitting: Family Medicine

## 2020-09-13 ENCOUNTER — Telehealth (HOSPITAL_COMMUNITY): Payer: Self-pay | Admitting: Family

## 2020-09-13 DIAGNOSIS — U071 COVID-19: Secondary | ICD-10-CM

## 2020-09-13 NOTE — Progress Notes (Signed)
I connected by phone with Austin [name] on 09/13/2020 at 7:45 PM to discuss the potential use of a new treatment for mild to moderate COVID-19 viral infection in non-hospitalized patients.  This [name] is a 54 y.o. male that meets the FDA criteria for Emergency Use Authorization of COVID monoclonal antibody.  Has a (+) direct SARS-CoV-2 viral test result  Has mild or moderate COVID-19   Is NOT hospitalized due to COVID-19  Is within 10 days of symptom onset  Has at least one of the high risk factor(s) for progression to severe COVID-19 and/or hospitalization as defined in EUA.  Specific high risk criteria : BMI > 25 and Cardiovascular disease or hypertension   Symptom onset 09/06/20.   I have spoken and communicated the following to the [name] or parent/caregiver regarding COVID monoclonal antibody treatment:  1. FDA has authorized the emergency use for the treatment of mild to moderate COVID-19 in adults and pediatric patients with positive results of direct SARS-CoV-2 viral testing who are 39 years of age and older weighing at least 40 kg, and who are at high risk for progressing to severe COVID-19 and/or hospitalization.  2. The significant known and potential risks and benefits of COVID monoclonal antibody, and the extent to which such potential risks and benefits are unknown.  3. Information on available alternative treatments and the risks and benefits of those alternatives, including clinical trials.  4. Patients treated with COVID monoclonal antibody should continue to self-isolate and use infection control measures (e.g., wear mask, isolate, social distance, avoid sharing personal items, clean and disinfect "high touch" surfaces, and frequent handwashing) according to CDC guidelines.   5. The [name] or parent/caregiver has the option to accept or refuse COVID monoclonal antibody treatment.  After reviewing this information with the [name], the [name] has agreed to receive  one of the available covid 19 monoclonal antibodies and will be provided an appropriate fact sheet prior to infusion. Asencion Gowda, NP 09/13/2020 7:45 PM

## 2020-09-13 NOTE — Telephone Encounter (Signed)
I have reported again positive covid to infusion center can take up to 72 hours.

## 2020-09-13 NOTE — Telephone Encounter (Signed)
This patient tested positive for COVID and his wife reported he was to be referred to the infusion clinic though they have not heard anything. Can you check on this? Thanks.

## 2020-09-13 NOTE — Telephone Encounter (Signed)
Thanks

## 2020-09-13 NOTE — Telephone Encounter (Addendum)
Called to discuss with Randell Patient about Covid symptoms and potential candidacy for the use of  a combination monoclonal antibody infusion for those with mild to moderate Covid symptoms and at a high risk of hospitalization.     Pt is qualified for this infusion at the infusion center due to co-morbid conditions and/or a member of an at-risk group, however unable to reach patient. VM left.   Mikayla Chiusano,NP

## 2020-09-14 ENCOUNTER — Ambulatory Visit (HOSPITAL_COMMUNITY)
Admission: RE | Admit: 2020-09-14 | Discharge: 2020-09-14 | Disposition: A | Discharge status: Home / Self Care | Payer: BC Managed Care – PPO | Source: Ambulatory Visit | Attending: Pulmonary Disease | Admitting: Pulmonary Disease

## 2020-09-14 DIAGNOSIS — I2510 Atherosclerotic heart disease of native coronary artery without angina pectoris: Secondary | ICD-10-CM | POA: Diagnosis not present

## 2020-09-14 DIAGNOSIS — U071 COVID-19: Secondary | ICD-10-CM | POA: Insufficient documentation

## 2020-09-14 MED ORDER — ALBUTEROL SULFATE HFA 108 (90 BASE) MCG/ACT IN AERS: 2.0000 | INHALATION_SPRAY | Freq: Once | RESPIRATORY_TRACT | Status: DC | PRN

## 2020-09-14 MED ORDER — EPINEPHRINE 0.3 MG/0.3ML IJ SOAJ: 0.3000 mg | Freq: Once | INTRAMUSCULAR | Status: DC | PRN

## 2020-09-14 MED ORDER — SOTROVIMAB 500 MG/8ML IV SOLN
500.0000 mg | Freq: Once | INTRAVENOUS | Status: AC
  Administered 2020-09-14: 500 mg via INTRAVENOUS

## 2020-09-14 MED ORDER — DIPHENHYDRAMINE HCL 50 MG/ML IJ SOLN: 50.0000 mg | Freq: Once | INTRAMUSCULAR | Status: DC | PRN

## 2020-09-14 MED ORDER — FAMOTIDINE IN NACL 20-0.9 MG/50ML-% IV SOLN: 20.0000 mg | Freq: Once | INTRAVENOUS | Status: DC | PRN

## 2020-09-14 MED ORDER — SODIUM CHLORIDE 0.9 % IV SOLN: INTRAVENOUS | Status: DC | PRN

## 2020-09-14 MED ORDER — METHYLPREDNISOLONE SODIUM SUCC 125 MG IJ SOLR: 125.0000 mg | Freq: Once | INTRAMUSCULAR | Status: DC | PRN

## 2020-09-14 NOTE — Discharge Instructions (Signed)

## 2020-09-28 ENCOUNTER — Ambulatory Visit: Payer: BC Managed Care – PPO

## 2020-09-29 ENCOUNTER — Ambulatory Visit: Payer: BC Managed Care – PPO

## 2020-09-29 ENCOUNTER — Other Ambulatory Visit: Payer: Self-pay

## 2020-09-29 VITALS — BP 112/80 | HR 70

## 2020-09-29 DIAGNOSIS — Z0130 Encounter for examination of blood pressure without abnormal findings: Secondary | ICD-10-CM

## 2020-09-29 NOTE — Progress Notes (Signed)
Patient is here for a BP. Currently patients BP is 112/80 and BPM is 70 with a Oxygen at 96.  Patient has no complaints of headaches, blurry vision, chest pain, arm pain, light headedness, dizziness, and nor jaw pain. Please see previous note on 09/08/2020 for order.

## 2020-10-19 ENCOUNTER — Other Ambulatory Visit: Payer: Self-pay | Admitting: Family Medicine

## 2020-10-19 DIAGNOSIS — G8929 Other chronic pain: Secondary | ICD-10-CM

## 2020-10-19 DIAGNOSIS — M542 Cervicalgia: Secondary | ICD-10-CM

## 2020-11-27 ENCOUNTER — Other Ambulatory Visit: Payer: Self-pay | Admitting: Family Medicine

## 2020-11-28 ENCOUNTER — Other Ambulatory Visit: Payer: Self-pay | Admitting: Family Medicine

## 2020-11-28 DIAGNOSIS — M542 Cervicalgia: Secondary | ICD-10-CM

## 2020-11-28 DIAGNOSIS — G8929 Other chronic pain: Secondary | ICD-10-CM

## 2020-12-07 ENCOUNTER — Other Ambulatory Visit: Payer: Self-pay | Admitting: Family Medicine

## 2020-12-08 ENCOUNTER — Other Ambulatory Visit: Payer: Self-pay

## 2020-12-11 ENCOUNTER — Ambulatory Visit: Payer: BC Managed Care – PPO | Admitting: Family Medicine

## 2020-12-11 ENCOUNTER — Other Ambulatory Visit: Payer: Self-pay | Admitting: Family Medicine

## 2020-12-11 ENCOUNTER — Other Ambulatory Visit: Payer: Self-pay

## 2020-12-11 ENCOUNTER — Encounter: Payer: Self-pay | Admitting: Family Medicine

## 2020-12-11 DIAGNOSIS — F909 Attention-deficit hyperactivity disorder, unspecified type: Secondary | ICD-10-CM

## 2020-12-11 DIAGNOSIS — G8929 Other chronic pain: Secondary | ICD-10-CM | POA: Diagnosis not present

## 2020-12-11 DIAGNOSIS — M542 Cervicalgia: Secondary | ICD-10-CM | POA: Diagnosis not present

## 2020-12-11 DIAGNOSIS — I10 Essential (primary) hypertension: Secondary | ICD-10-CM

## 2020-12-11 MED ORDER — BACLOFEN 10 MG PO TABS: 10.0000 mg | ORAL_TABLET | Freq: Every evening | ORAL | 0 refills | Status: DC | PRN

## 2020-12-11 NOTE — Patient Instructions (Signed)
Nice to see you. We will get labs today and contact you with the results. Please be careful when you take your tramadol or the baclofen.  They may make you drowsy.  If they make you excessively drowsy please let us know and do not drive.

## 2020-12-11 NOTE — Progress Notes (Signed)
Tommi Rumps, MD Phone: [phone]  Austin [name] is a 55 y.o. male who presents today for f/u.  HYPERTENSION  Disease Monitoring  Home BP Monitoring 125/80 Chest pain- no    Dyspnea- no Medications  Compliance-  Taking lisinopril, carvedilol, though ran out yesterday.   Edema- no  ADHD Medication: vyvanse 30 mg daily though does skip occasional days and weekends Effectiveness: yes Palpitations: no Sleep difficulty: no Appetite suppression: no  Chronic neck pain: Possibly related to back issues.  He does take tramadol nightly.  On a very rare occasion he will take a muscle relaxer from his brother when his neck is bothering him significantly.  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  No numbness or weakness though does note occasional tingling in either of his hands when his neck has flared up.  He denies drowsiness the next day with his tramadol or with the muscle relaxer.     Social History   Tobacco Use  Smoking Status Former Smoker  . Packs/day: 1.00  . Years: 30.00  . Pack years: 30.00  . Quit date: 01/10/2012  . Years since quitting: 8.9  Smokeless Tobacco Never Used    Current Outpatient Medications on File Prior to Visit  Medication Sig Dispense Refill  . carvedilol (COREG) 12.5 MG tablet Take 1 tablet (12.5 mg total) by mouth 2 (two) times daily with a meal. 180 tablet 1  . lisdexamfetamine (VYVANSE) 30 MG capsule Take 1 capsule (30 mg total) by mouth daily. 30 capsule 0  . lisdexamfetamine (VYVANSE) 30 MG capsule Take 1 capsule (30 mg total) by mouth daily. 30 capsule 0  . lisdexamfetamine (VYVANSE) 30 MG capsule Take 1 capsule (30 mg total) by mouth daily. 30 capsule 0  . meloxicam (MOBIC) 7.5 MG tablet TAKE 1 TABLET (7.5 MG TOTAL) BY MOUTH 2 (TWO) TIMES DAILY. 180 tablet 1  . nitroGLYCERIN (NITRODUR - DOSED IN MG/24 HR) 0.2 mg/hr patch Apply 1/4 patch daily to tendon for tendonitis. 30 patch 1  . omeprazole  (PRILOSEC) 20 MG capsule TAKE 1 CAPSULE BY MOUTH EVERY DAY 90 capsule 1  . sertraline (ZOLOFT) 50 MG tablet TAKE 1 TABLET BY MOUTH EVERY DAY 90 tablet 1  . traMADol (ULTRAM) 50 MG tablet TAKE 1 TABLET BY MOUTH EVERY DAY AS NEEDED 30 tablet 0   Current Facility-Administered Medications on File Prior to Visit  Medication Dose Route Frequency Provider Last Rate Last Admin  . 0.9 %  sodium chloride infusion  500 mL Intravenous Continuous Pyrtle, Lajuan Lines, MD         ROS see history of present illness  Objective  Physical Exam Vitals:   12/11/20 1444  BP: 120/80  Pulse: 80  Temp: (!) 97.5 F (36.4 C)  SpO2: 98%    BP Readings from Last 3 Encounters:  12/11/20 120/80  09/29/20 112/80  09/14/20 (!) 122/92   Wt Readings from Last 3 Encounters:  12/11/20 214 lb 6.4 oz (97.3 kg)  09/08/20 206 lb 3.2 oz (93.5 kg)  04/12/20 204 lb 3.2 oz (92.6 kg)    Physical Exam Constitutional:      General: He is not in acute distress.    Appearance: He is not diaphoretic.  Cardiovascular:     Rate and Rhythm: Normal rate and regular rhythm.     Heart sounds: Normal heart sounds.  Pulmonary:     Effort: Pulmonary effort is normal.     Breath  sounds: Normal breath sounds.  Musculoskeletal:        General: No edema.  Skin:    General: Skin is warm and dry.  Neurological:     Mental Status: He is alert.     Comments: 5/5 strength in bilateral biceps, triceps, grip, quads, hamstrings, plantar and dorsiflexion, sensation to light touch intact in bilateral UE and LE, normal gait      Assessment/Plan: Please see individual problem list.  Problem List Items Addressed This Visit    ADHD    Stable.  He will continue Vyvanse 30 mg once daily.  He will let us know when he needs a refill.      Chronic neck pain    Chronic issue.  Stable.  He can continue tramadol once nightly.  We will also add on baclofen to take on an as-needed basis as a muscle relaxer.  Advised to monitor for drowsiness  with these medications.      Relevant Medications   baclofen (LIORESAL) 10 MG tablet   Hypertension    Adequately controlled.  We will continue lisinopril and carvedilol.  Check BMP today.      Relevant Orders   Basic Metabolic Panel (BMET)        This visit occurred during the SARS-CoV-2 public health emergency.  Safety protocols were in place, including screening questions prior to the visit, additional usage of staff PPE, and extensive cleaning of exam room while observing appropriate contact time as indicated for disinfecting solutions.    Tommi Rumps, MD [place]

## 2020-12-11 NOTE — Assessment & Plan Note (Signed)
Adequately controlled.  We will continue lisinopril and carvedilol.  Check BMP today.

## 2020-12-11 NOTE — Assessment & Plan Note (Signed)
Chronic issue.  Stable.  He can continue tramadol once nightly.  We will also add on baclofen to take on an as-needed basis as a muscle relaxer.  Advised to monitor for drowsiness with these medications.

## 2020-12-11 NOTE — Assessment & Plan Note (Signed)
Stable.  He will continue Vyvanse 30 mg once daily.  He will let us know when he needs a refill.

## 2020-12-12 LAB — BASIC METABOLIC PANEL
BUN/Creatinine Ratio: 13 (ref 9–20)
BUN: 13 mg/dL (ref 6–24)
CO2: 28 mmol/L (ref 20–29)
Calcium: 9.2 mg/dL (ref 8.7–10.2)
Chloride: 102 mmol/L (ref 96–106)
Creatinine, Ser: 0.98 mg/dL (ref 0.76–1.27)
GFR calc Af Amer: 101 mL/min/{1.73_m2} (ref >59)
GFR calc non Af Amer: 87 mL/min/{1.73_m2} (ref >59)
Glucose: 82 mg/dL (ref 65–99)
Potassium: 4.1 mmol/L (ref 3.5–5.2)
Sodium: 141 mmol/L (ref 134–144)

## 2020-12-18 ENCOUNTER — Other Ambulatory Visit: Payer: Self-pay | Admitting: Family Medicine

## 2020-12-18 DIAGNOSIS — G8929 Other chronic pain: Secondary | ICD-10-CM

## 2020-12-18 DIAGNOSIS — M542 Cervicalgia: Secondary | ICD-10-CM

## 2021-01-03 ENCOUNTER — Other Ambulatory Visit: Payer: Self-pay | Admitting: Family Medicine

## 2021-01-03 DIAGNOSIS — G8929 Other chronic pain: Secondary | ICD-10-CM

## 2021-01-08 ENCOUNTER — Other Ambulatory Visit: Payer: Self-pay | Admitting: Family Medicine

## 2021-01-08 DIAGNOSIS — G8929 Other chronic pain: Secondary | ICD-10-CM

## 2021-01-08 DIAGNOSIS — M542 Cervicalgia: Secondary | ICD-10-CM

## 2021-01-23 ENCOUNTER — Other Ambulatory Visit: Payer: Self-pay | Admitting: Family Medicine

## 2021-01-23 DIAGNOSIS — F909 Attention-deficit hyperactivity disorder, unspecified type: Secondary | ICD-10-CM

## 2021-01-23 NOTE — Telephone Encounter (Signed)
Up coming App scheduled for 03/12/21

## 2021-01-24 MED ORDER — LISDEXAMFETAMINE DIMESYLATE 30 MG PO CAPS: 30.0000 mg | ORAL_CAPSULE | Freq: Every day | ORAL | 0 refills | Status: DC

## 2021-01-25 ENCOUNTER — Other Ambulatory Visit: Payer: Self-pay | Admitting: Family Medicine

## 2021-01-25 DIAGNOSIS — M542 Cervicalgia: Secondary | ICD-10-CM

## 2021-01-25 DIAGNOSIS — G8929 Other chronic pain: Secondary | ICD-10-CM

## 2021-02-03 ENCOUNTER — Other Ambulatory Visit: Payer: Self-pay | Admitting: Family Medicine

## 2021-02-03 DIAGNOSIS — G8929 Other chronic pain: Secondary | ICD-10-CM

## 2021-02-03 DIAGNOSIS — M542 Cervicalgia: Secondary | ICD-10-CM

## 2021-02-13 ENCOUNTER — Other Ambulatory Visit: Payer: Self-pay | Admitting: Family Medicine

## 2021-02-13 DIAGNOSIS — G8929 Other chronic pain: Secondary | ICD-10-CM

## 2021-02-27 ENCOUNTER — Other Ambulatory Visit: Payer: Self-pay | Admitting: Family Medicine

## 2021-02-27 DIAGNOSIS — G8929 Other chronic pain: Secondary | ICD-10-CM

## 2021-03-06 ENCOUNTER — Other Ambulatory Visit: Payer: Self-pay

## 2021-03-07 ENCOUNTER — Other Ambulatory Visit: Payer: Self-pay | Admitting: Family Medicine

## 2021-03-07 ENCOUNTER — Other Ambulatory Visit: Payer: Self-pay | Admitting: Internal Medicine

## 2021-03-07 DIAGNOSIS — I10 Essential (primary) hypertension: Secondary | ICD-10-CM

## 2021-03-12 ENCOUNTER — Ambulatory Visit: Payer: BC Managed Care – PPO | Admitting: Family Medicine

## 2021-03-12 ENCOUNTER — Other Ambulatory Visit: Payer: Self-pay

## 2021-03-12 ENCOUNTER — Encounter: Payer: Self-pay | Admitting: Family Medicine

## 2021-03-12 DIAGNOSIS — F909 Attention-deficit hyperactivity disorder, unspecified type: Secondary | ICD-10-CM | POA: Diagnosis not present

## 2021-03-12 DIAGNOSIS — G8929 Other chronic pain: Secondary | ICD-10-CM

## 2021-03-12 DIAGNOSIS — M542 Cervicalgia: Secondary | ICD-10-CM | POA: Diagnosis not present

## 2021-03-12 DIAGNOSIS — I10 Essential (primary) hypertension: Secondary | ICD-10-CM

## 2021-03-12 MED ORDER — LISDEXAMFETAMINE DIMESYLATE 30 MG PO CAPS: 30.0000 mg | ORAL_CAPSULE | Freq: Every day | ORAL | 0 refills | Status: DC

## 2021-03-12 MED ORDER — TRAMADOL HCL 50 MG PO TABS: 50.0000 mg | ORAL_TABLET | Freq: Every day | ORAL | 0 refills | Status: DC | PRN

## 2021-03-12 MED ORDER — MELOXICAM 15 MG PO TABS: 15.0000 mg | ORAL_TABLET | Freq: Every day | ORAL | 1 refills | Status: DC

## 2021-03-12 NOTE — Progress Notes (Signed)
Austin Rumps, MD Phone: [phone]  FLEET HIGHAM is a 55 y.o. male who presents today for f/u.  HYPERTENSION  Disease Monitoring  Home BP Monitoring similar to today Chest pain- no    Dyspnea- no Medications  Compliance-  Taking lisinopril, coreg  Edema- no  ADHD Medication: vyvanse Effectiveness: yes Palpitations: no Sleep difficulty: no Appetite suppression: no  Chronic neck Pain: Stable.  He reports he seen multiple specialist in the past and they told him it was just scar tissue there is nothing he can do for it.  He takes tramadol nightly and this does help him get about 3 hours of sleep though after that he is tossing and turning trying to get comfortable related to discomfort.  He takes meloxicam 7.5 mg twice daily and notes his insurance may quit paying for it as a twice daily medication.  He also occasionally takes baclofen.  Social History   Tobacco Use  Smoking Status Former Smoker  . Packs/day: 1.00  . Years: 30.00  . Pack years: 30.00  . Quit date: 01/10/2012  . Years since quitting: 9.1  Smokeless Tobacco Never Used    Current Outpatient Medications on File Prior to Visit  Medication Sig Dispense Refill  . baclofen (LIORESAL) 10 MG tablet TAKE 1 TABLET BY MOUTH AT BEDTIME AS NEEDED FOR MUSCLE SPASMS. 90 tablet 1  . carvedilol (COREG) 12.5 MG tablet TAKE 1 TABLET (12.5 MG TOTAL) BY MOUTH 2 (TWO) TIMES DAILY WITH A MEAL. 180 tablet 1  . lisinopril (ZESTRIL) 40 MG tablet TAKE 1 TABLET BY MOUTH EVERY DAY 90 tablet 1  . omeprazole (PRILOSEC) 20 MG capsule TAKE 1 CAPSULE BY MOUTH EVERY DAY 90 capsule 1  . sertraline (ZOLOFT) 50 MG tablet TAKE 1 TABLET BY MOUTH EVERY DAY 90 tablet 1  . nitroGLYCERIN (NITRODUR - DOSED IN MG/24 HR) 0.2 mg/hr patch Apply 1/4 patch daily to tendon for tendonitis. 30 patch 1   Current Facility-Administered Medications on File Prior to Visit  Medication Dose Route Frequency Provider Last Rate Last Admin  . 0.9 %  sodium chloride  infusion  500 mL Intravenous Continuous Pyrtle, Lajuan Lines, MD         ROS see history of present illness  Objective  Physical Exam Vitals:   03/12/21 1336  BP: 120/80  Pulse: 77  Temp: 98.1 F (36.7 C)  SpO2: 98%    BP Readings from Last 3 Encounters:  03/12/21 120/80  12/11/20 120/80  09/29/20 112/80   Wt Readings from Last 3 Encounters:  03/12/21 207 lb 6.4 oz (94.1 kg)  12/11/20 214 lb 6.4 oz (97.3 kg)  09/08/20 206 lb 3.2 oz (93.5 kg)    Physical Exam Constitutional:      General: He is not in acute distress.    Appearance: He is not diaphoretic.  Cardiovascular:     Rate and Rhythm: Normal rate and regular rhythm.     Heart sounds: Normal heart sounds.  Pulmonary:     Effort: Pulmonary effort is normal.     Breath sounds: Normal breath sounds.  Musculoskeletal:     Right lower leg: No edema.     Left lower leg: No edema.  Skin:    General: Skin is warm and dry.  Neurological:     Mental Status: He is alert.     Comments: 5/5 strength in bilateral biceps, triceps, grip, quads, hamstrings, plantar and dorsiflexion, sensation to light touch intact in bilateral UE and LE  Assessment/Plan: Please see individual problem list.  Problem List Items Addressed This Visit    ADHD    Stable.  He will continue Vyvanse 30 mg once daily.  Refills provided.  Controlled substance database reviewed.      Relevant Medications   lisdexamfetamine (VYVANSE) 30 MG capsule (Start on 05/12/2021)   lisdexamfetamine (VYVANSE) 30 MG capsule (Start on 04/12/2021)   lisdexamfetamine (VYVANSE) 30 MG capsule   Chronic neck pain    Stable.  His sleep is fairly significantly interrupted by the chronic discomfort.  We will try tramadol 100 mg nightly as needed for the discomfort.  If he does not feel like he needs that dose he can use the 50 mg dose.  He was advised to monitor for drowsiness and any respiratory issues with this.  We will also continue meloxicam though we will switch to 15  mg once daily given potential insurance issues.  He can also continue baclofen as needed.  He will monitor for drowsiness.      Relevant Medications   meloxicam (MOBIC) 15 MG tablet   traMADol (ULTRAM) 50 MG tablet   Hypertension    Well-controlled.  He will continue lisinopril 40 mg once daily and carvedilol 12.5 mg twice daily.         This visit occurred during the SARS-CoV-2 public health emergency.  Safety protocols were in place, including screening questions prior to the visit, additional usage of staff PPE, and extensive cleaning of exam room while observing appropriate contact time as indicated for disinfecting solutions.    Austin Rumps, MD [place]

## 2021-03-12 NOTE — Assessment & Plan Note (Signed)
Stable.  He will continue Vyvanse 30 mg once daily.  Refills provided.  Controlled substance database reviewed.

## 2021-03-12 NOTE — Assessment & Plan Note (Signed)
Well-controlled.  He will continue lisinopril 40 mg once daily and carvedilol 12.5 mg twice daily.

## 2021-03-12 NOTE — Assessment & Plan Note (Signed)
Stable.  His sleep is fairly significantly interrupted by the chronic discomfort.  We will try tramadol 100 mg nightly as needed for the discomfort.  If he does not feel like he needs that dose he can use the 50 mg dose.  He was advised to monitor for drowsiness and any respiratory issues with this.  We will also continue meloxicam though we will switch to 15 mg once daily given potential insurance issues.  He can also continue baclofen as needed.  He will monitor for drowsiness.

## 2021-03-12 NOTE — Patient Instructions (Signed)
Nice to see you. You can try the tramadol at 100 mg nightly for your discomfort to see if that helps.  Please monitor for any drowsiness or respiratory issues with this. I also sent in meloxicam 15 mg once daily given your insurance issues with your current dosing of meloxicam.

## 2021-05-02 ENCOUNTER — Other Ambulatory Visit: Payer: Self-pay | Admitting: Family Medicine

## 2021-05-02 DIAGNOSIS — F909 Attention-deficit hyperactivity disorder, unspecified type: Secondary | ICD-10-CM

## 2021-05-02 NOTE — Telephone Encounter (Signed)
I believe he should have a refill available already at the pharmacy. Please check with the patient on this. Thanks.

## 2021-05-03 NOTE — Telephone Encounter (Signed)
LM for patient to contact pharmacy to see if he has refill & to let us know if issues.

## 2021-05-11 ENCOUNTER — Other Ambulatory Visit: Payer: Self-pay | Admitting: Family Medicine

## 2021-05-11 DIAGNOSIS — M542 Cervicalgia: Secondary | ICD-10-CM

## 2021-05-11 DIAGNOSIS — G8929 Other chronic pain: Secondary | ICD-10-CM

## 2021-05-11 NOTE — Telephone Encounter (Signed)
RX Refill:tramadol Last Seen:03-12-21  Last ordered:03-12-21

## 2021-06-01 ENCOUNTER — Other Ambulatory Visit: Payer: Self-pay | Admitting: Family Medicine

## 2021-06-15 ENCOUNTER — Other Ambulatory Visit: Payer: Self-pay

## 2021-06-15 ENCOUNTER — Ambulatory Visit: Payer: BC Managed Care – PPO | Admitting: Family Medicine

## 2021-06-15 ENCOUNTER — Encounter: Payer: Self-pay | Admitting: Family Medicine

## 2021-06-15 DIAGNOSIS — F909 Attention-deficit hyperactivity disorder, unspecified type: Secondary | ICD-10-CM | POA: Diagnosis not present

## 2021-06-15 DIAGNOSIS — E669 Obesity, unspecified: Secondary | ICD-10-CM

## 2021-06-15 DIAGNOSIS — G8929 Other chronic pain: Secondary | ICD-10-CM | POA: Diagnosis not present

## 2021-06-15 DIAGNOSIS — I10 Essential (primary) hypertension: Secondary | ICD-10-CM | POA: Diagnosis not present

## 2021-06-15 DIAGNOSIS — M542 Cervicalgia: Secondary | ICD-10-CM

## 2021-06-15 MED ORDER — LISDEXAMFETAMINE DIMESYLATE 40 MG PO CAPS: 40.0000 mg | ORAL_CAPSULE | Freq: Every day | ORAL | 0 refills | Status: DC

## 2021-06-15 NOTE — Progress Notes (Signed)
Tommi Rumps, MD Phone: [phone]  Austin [name] is a 55 y.o. male who presents today for f/u.  ADHD Medication: vyvanse 30 mg daily Effectiveness: no, notes progressive trouble focusing at work Palpitations: no Sleep difficulty: no Appetite suppression: no  HYPERTENSION Disease Monitoring Home BP Monitoring similar to today Chest pain- no    Dyspnea- no Medications Compliance-  taking coreg, lisinopril  Edema- no  Chronic neck pain: This is a chronic stable issue.  Notes that if he does any activity with lifting that will cause issues.  He notes the meloxicam has been quite helpful.  He does take tramadol typically 50 mg when he does take it though occasionally he has had to take the 100 mg dose when he has severe pain.  Obesity: Patient tries to stay active though his neck does limit his ability to do significant activity.  His diet is not the best.  He does eat vegetables though notes he is likely to add bacon to them.  He has cut down on the amount of sweet tea he is drinking.  He is drinking more water.    Social History   Tobacco Use  Smoking Status Former   Packs/day: 1.00   Years: 30.00   Pack years: 30.00   Types: Cigarettes   Quit date: 01/10/2012   Years since quitting: 9.4  Smokeless Tobacco Never    Current Outpatient Medications on File Prior to Visit  Medication Sig Dispense Refill   baclofen (LIORESAL) 10 MG tablet TAKE 1 TABLET BY MOUTH AT BEDTIME AS NEEDED FOR MUSCLE SPASMS. 90 tablet 1   carvedilol (COREG) 12.5 MG tablet TAKE 1 TABLET (12.5 MG TOTAL) BY MOUTH 2 (TWO) TIMES DAILY WITH A MEAL. 180 tablet 1   lisinopril (ZESTRIL) 40 MG tablet TAKE 1 TABLET BY MOUTH EVERY DAY 90 tablet 1   meloxicam (MOBIC) 15 MG tablet Take 1 tablet (15 mg total) by mouth daily. 90 tablet 1   omeprazole (PRILOSEC) 20 MG capsule TAKE 1 CAPSULE BY MOUTH EVERY DAY 90 capsule 1   sertraline (ZOLOFT) 50 MG tablet TAKE 1 TABLET BY MOUTH EVERY DAY 90 tablet 1   traMADol  (ULTRAM) 50 MG tablet TAKE 1-2 TABLETS (50-100 MG TOTAL) BY MOUTH DAILY AS NEEDED FOR MODERATE PAIN. 60 tablet 0   nitroGLYCERIN (NITRODUR - DOSED IN MG/24 HR) 0.2 mg/hr patch Apply 1/4 patch daily to tendon for tendonitis. 30 patch 1   Current Facility-Administered Medications on File Prior to Visit  Medication Dose Route Frequency Provider Last Rate Last Admin   0.9 %  sodium chloride infusion  500 mL Intravenous Continuous Pyrtle, Lajuan Lines, MD         ROS see history of present illness  Objective  Physical Exam Vitals:   06/15/21 0847  BP: 115/70  Pulse: 80  Temp: 98.1 F (36.7 C)  SpO2: 97%    BP Readings from Last 3 Encounters:  06/15/21 115/70  03/12/21 120/80  12/11/20 120/80   Wt Readings from Last 3 Encounters:  06/15/21 213 lb (96.6 kg)  03/12/21 207 lb 6.4 oz (94.1 kg)  12/11/20 214 lb 6.4 oz (97.3 kg)    Physical Exam Constitutional:      General: He is not in acute distress.    Appearance: He is not diaphoretic.  Cardiovascular:     Rate and Rhythm: Normal rate and regular rhythm.     Heart sounds: Normal heart sounds.  Pulmonary:     Effort: Pulmonary effort is  normal.     Breath sounds: Normal breath sounds.  Musculoskeletal:     Right lower leg: No edema.     Left lower leg: No edema.  Skin:    General: Skin is warm and dry.  Neurological:     Mental Status: He is alert.     Assessment/Plan: Please see individual problem list.  Problem List Items Addressed This Visit     ADHD    Not well controlled.  We will increase the Vyvanse dose to 40 mg once daily.  He will monitor for appetite suppression, sleep changes, and palpitations.  If those occur he will let us know.  He will follow-up in 1 month.       Relevant Medications   lisdexamfetamine (VYVANSE) 40 MG capsule   Chronic neck pain    This is stable.  He can continue the meloxicam and tramadol as prescribed.       Hypertension    Well-controlled.  He will continue carvedilol 12.5  mg twice daily and lisinopril 40 mg daily.  Lab work today.       Relevant Orders   Comp Met (CMET)   Lipid panel   Obesity (BMI 30-39.9)    Encourage remaining as active as able to.  Discussed dietary changes.  Check A1c.       Relevant Medications   lisdexamfetamine (VYVANSE) 40 MG capsule   Other Relevant Orders   HgB A1c    Return in about 1 month (around 07/16/2021) for ADHD.  This visit occurred during the SARS-CoV-2 public health emergency.  Safety protocols were in place, including screening questions prior to the visit, additional usage of staff PPE, and extensive cleaning of exam room while observing appropriate contact time as indicated for disinfecting solutions.    Tommi Rumps, MD [place]

## 2021-06-15 NOTE — Assessment & Plan Note (Signed)
Encourage remaining as active as able to.  Discussed dietary changes.  Check A1c.

## 2021-06-15 NOTE — Patient Instructions (Signed)
Nice to see you. We will get lab work today. We will increase your Vyvanse dose to 40 mg once daily.  If you notice any appetite suppression, sleep changes, or palpitations with the Vyvanse increase please let us know.

## 2021-06-15 NOTE — Assessment & Plan Note (Signed)
This is stable.  He can continue the meloxicam and tramadol as prescribed.

## 2021-06-15 NOTE — Assessment & Plan Note (Signed)
Not well controlled.  We will increase the Vyvanse dose to 40 mg once daily.  He will monitor for appetite suppression, sleep changes, and palpitations.  If those occur he will let us know.  He will follow-up in 1 month.

## 2021-06-15 NOTE — Assessment & Plan Note (Signed)
Well-controlled.  He will continue carvedilol 12.5 mg twice daily and lisinopril 40 mg daily.  Lab work today.

## 2021-06-16 LAB — LIPID PANEL
Chol/HDL Ratio: 3.4 ratio (ref 0.0–5.0)
Cholesterol, Total: 169 mg/dL (ref 100–199)
HDL: 50 mg/dL (ref >39)
LDL Chol Calc (NIH): 94 mg/dL (ref 0–99)
Triglycerides: 142 mg/dL (ref 0–149)
VLDL Cholesterol Cal: 25 mg/dL (ref 5–40)

## 2021-06-16 LAB — COMPREHENSIVE METABOLIC PANEL
ALT: 19 IU/L (ref 0–44)
AST: 22 IU/L (ref 0–40)
Albumin/Globulin Ratio: 2.1 (ref 1.2–2.2)
Albumin: 4.5 g/dL (ref 3.8–4.9)
Alkaline Phosphatase: 87 IU/L (ref 44–121)
BUN/Creatinine Ratio: 17 (ref 9–20)
BUN: 15 mg/dL (ref 6–24)
Bilirubin Total: 0.6 mg/dL (ref 0.0–1.2)
CO2: 23 mmol/L (ref 20–29)
Calcium: 9.1 mg/dL (ref 8.7–10.2)
Chloride: 103 mmol/L (ref 96–106)
Creatinine, Ser: 0.89 mg/dL (ref 0.76–1.27)
Globulin, Total: 2.1 g/dL (ref 1.5–4.5)
Glucose: 100 mg/dL — ABNORMAL HIGH (ref 65–99)
Potassium: 4.4 mmol/L (ref 3.5–5.2)
Sodium: 142 mmol/L (ref 134–144)
Total Protein: 6.6 g/dL (ref 6.0–8.5)
eGFR: 101 mL/min/{1.73_m2} (ref >59)

## 2021-06-16 LAB — HEMOGLOBIN A1C
Est. average glucose Bld gHb Est-mCnc: 105 mg/dL
Hgb A1c MFr Bld: 5.3 % (ref 4.8–5.6)

## 2021-07-11 ENCOUNTER — Other Ambulatory Visit: Payer: Self-pay | Admitting: Family Medicine

## 2021-07-11 DIAGNOSIS — M542 Cervicalgia: Secondary | ICD-10-CM

## 2021-07-11 DIAGNOSIS — G8929 Other chronic pain: Secondary | ICD-10-CM

## 2021-07-17 ENCOUNTER — Ambulatory Visit: Payer: BC Managed Care – PPO | Admitting: Family Medicine

## 2021-07-20 ENCOUNTER — Other Ambulatory Visit: Payer: Self-pay | Admitting: Family Medicine

## 2021-08-01 ENCOUNTER — Encounter: Payer: Self-pay | Admitting: Family Medicine

## 2021-08-01 DIAGNOSIS — F909 Attention-deficit hyperactivity disorder, unspecified type: Secondary | ICD-10-CM

## 2021-08-02 MED ORDER — LISDEXAMFETAMINE DIMESYLATE 40 MG PO CAPS: 40.0000 mg | ORAL_CAPSULE | Freq: Every day | ORAL | 0 refills | Status: DC

## 2021-08-17 NOTE — Telephone Encounter (Addendum)
Can you call the pharmacy and see what the issue is?  This was sent previously think they should have been able to fill it because it was a dose change.  There may be some insurance issues prevented [place] from the filled.

## 2021-08-17 NOTE — Telephone Encounter (Signed)
This medication needed a PA, I called the pharmacy and I did the PA on cover my meds awaiting for a answer.  Xitlalli Newhard,cma

## 2021-09-02 ENCOUNTER — Other Ambulatory Visit: Payer: Self-pay | Admitting: Family Medicine

## 2021-09-02 DIAGNOSIS — M542 Cervicalgia: Secondary | ICD-10-CM

## 2021-09-02 DIAGNOSIS — G8929 Other chronic pain: Secondary | ICD-10-CM

## 2021-09-02 DIAGNOSIS — I10 Essential (primary) hypertension: Secondary | ICD-10-CM

## 2021-09-04 ENCOUNTER — Telehealth: Payer: Self-pay

## 2021-09-04 NOTE — Telephone Encounter (Signed)
A PA was done for Vyvanse and the patient was approved from 08/17/2021-10/07/225.

## 2021-09-12 ENCOUNTER — Other Ambulatory Visit: Payer: Self-pay | Admitting: Family Medicine

## 2021-09-12 DIAGNOSIS — G8929 Other chronic pain: Secondary | ICD-10-CM

## 2021-09-12 DIAGNOSIS — M542 Cervicalgia: Secondary | ICD-10-CM

## 2021-09-17 ENCOUNTER — Encounter: Payer: Self-pay | Admitting: Family Medicine

## 2021-09-17 DIAGNOSIS — Z1211 Encounter for screening for malignant neoplasm of colon: Secondary | ICD-10-CM

## 2021-09-18 ENCOUNTER — Encounter: Payer: Self-pay | Admitting: Internal Medicine

## 2021-10-24 ENCOUNTER — Other Ambulatory Visit: Payer: Self-pay

## 2021-10-24 ENCOUNTER — Ambulatory Visit: Payer: BC Managed Care – PPO | Admitting: Family Medicine

## 2021-10-24 ENCOUNTER — Encounter: Payer: Self-pay | Admitting: Family Medicine

## 2021-10-24 DIAGNOSIS — F909 Attention-deficit hyperactivity disorder, unspecified type: Secondary | ICD-10-CM | POA: Diagnosis not present

## 2021-10-24 DIAGNOSIS — I10 Essential (primary) hypertension: Secondary | ICD-10-CM

## 2021-10-24 MED ORDER — LISDEXAMFETAMINE DIMESYLATE 40 MG PO CAPS: 40.0000 mg | ORAL_CAPSULE | ORAL | 0 refills | Status: DC

## 2021-10-24 MED ORDER — LISDEXAMFETAMINE DIMESYLATE 40 MG PO CAPS: 40.0000 mg | ORAL_CAPSULE | Freq: Every day | ORAL | 0 refills | Status: DC

## 2021-10-24 NOTE — Assessment & Plan Note (Signed)
Well-controlled.  He will continue carvedilol 12.5 mg twice daily and lisinopril 40 mg daily.

## 2021-10-24 NOTE — Patient Instructions (Signed)
Nice to see you. I sent in refills of your Vyvanse.  If you notice any side effects we discussed please let me know.

## 2021-10-24 NOTE — Assessment & Plan Note (Signed)
Well-controlled.  He will continue Vyvanse 40 mg once daily.  Refill sent to pharmacy.  He will monitor for appetite suppression, sleep changes, and palpitations.  If these occur he will let us know.

## 2021-10-24 NOTE — Progress Notes (Signed)
Tommi Rumps, MD Phone: [phone]  Austin [name] is a 55 y.o. male who presents today for f/u.  ADHD Medication: vyvanse 40 mg daily Effectiveness: yes Palpitations: no Sleep difficulty: no Appetite suppression: no Noted some slight leg swelling after starting the 40 mg dose though this has resolved.   HYPERTENSION Disease Monitoring Home BP Monitoring not checking Chest pain- no    Dyspnea- no Medications Compliance-  taking lisinopril, coreg.  BMET    Component Value Date/Time   NA 142 06/15/2021 0928   K 4.4 06/15/2021 0928   CL 103 06/15/2021 0928   CO2 23 06/15/2021 0928   GLUCOSE 100 (H) 06/15/2021 0928   GLUCOSE 88 08/04/2018 1636   BUN 15 06/15/2021 0928   CREATININE 0.89 06/15/2021 0928   CALCIUM 9.1 06/15/2021 0928   GFRNONAA 87 12/11/2020 1502   GFRAA 101 12/11/2020 1502      Social History   Tobacco Use  Smoking Status Former   Packs/day: 1.00   Years: 30.00   Pack years: 30.00   Types: Cigarettes   Quit date: 01/10/2012   Years since quitting: 9.7  Smokeless Tobacco Never    Current Outpatient Medications on File Prior to Visit  Medication Sig Dispense Refill   baclofen (LIORESAL) 10 MG tablet TAKE 1 TABLET BY MOUTH AT BEDTIME AS NEEDED FOR MUSCLE SPASMS. 90 tablet 1   carvedilol (COREG) 12.5 MG tablet TAKE 1 TABLET (12.5 MG TOTAL) BY MOUTH 2 (TWO) TIMES DAILY WITH A MEAL. 180 tablet 1   lisinopril (ZESTRIL) 40 MG tablet TAKE 1 TABLET BY MOUTH EVERY DAY 90 tablet 1   meloxicam (MOBIC) 15 MG tablet Take 1 tablet (15 mg total) by mouth daily. 90 tablet 1   omeprazole (PRILOSEC) 20 MG capsule TAKE 1 CAPSULE BY MOUTH EVERY DAY 90 capsule 1   sertraline (ZOLOFT) 50 MG tablet TAKE 1 TABLET BY MOUTH EVERY DAY 90 tablet 1   traMADol (ULTRAM) 50 MG tablet TAKE 1-2 TABLETS (50-100 MG TOTAL) BY MOUTH DAILY AS NEEDED FOR MODERATE PAIN. 60 tablet 0   Current Facility-Administered Medications on File Prior to Visit  Medication Dose Route Frequency  Provider Last Rate Last Admin   0.9 %  sodium chloride infusion  500 mL Intravenous Continuous Pyrtle, Lajuan Lines, MD         ROS see history of present illness  Objective  Physical Exam Vitals:   10/24/21 1401  BP: 119/80  Pulse: 60  Temp: 97.9 F (36.6 C)  SpO2: 97%    BP Readings from Last 3 Encounters:  10/24/21 119/80  06/15/21 115/70  03/12/21 120/80   Wt Readings from Last 3 Encounters:  10/24/21 214 lb 8 oz (97.3 kg)  06/15/21 213 lb (96.6 kg)  03/12/21 207 lb 6.4 oz (94.1 kg)    Physical Exam Constitutional:      General: He is not in acute distress.    Appearance: He is not diaphoretic.  Cardiovascular:     Rate and Rhythm: Normal rate and regular rhythm.     Heart sounds: Normal heart sounds.  Pulmonary:     Effort: Pulmonary effort is normal.     Breath sounds: Normal breath sounds.  Skin:    General: Skin is warm and dry.  Neurological:     Mental Status: He is alert.     Assessment/Plan: Please see individual problem list.  Problem List Items Addressed This Visit     ADHD    Well-controlled.  He will continue  Vyvanse 40 mg once daily.  Refill sent to pharmacy.  He will monitor for appetite suppression, sleep changes, and palpitations.  If these occur he will let us know.      Relevant Medications   lisdexamfetamine (VYVANSE) 40 MG capsule (Start on 12/25/2021)   lisdexamfetamine (VYVANSE) 40 MG capsule (Start on 11/24/2021)   lisdexamfetamine (VYVANSE) 40 MG capsule   Hypertension    Well-controlled.  He will continue carvedilol 12.5 mg twice daily and lisinopril 40 mg daily.        Health Maintenance: Patient reports his colonoscopy is scheduled for January.  Return in about 3 months (around 01/22/2022) for ADHD, hypertension.  This visit occurred during the SARS-CoV-2 public health emergency.  Safety protocols were in place, including screening questions prior to the visit, additional usage of staff PPE, and extensive cleaning of exam room  while observing appropriate contact time as indicated for disinfecting solutions.    Tommi Rumps, MD [place]

## 2021-11-15 ENCOUNTER — Ambulatory Visit (AMBULATORY_SURGERY_CENTER): Payer: BC Managed Care – PPO | Admitting: *Deleted

## 2021-11-15 ENCOUNTER — Other Ambulatory Visit: Payer: Self-pay

## 2021-11-15 VITALS — Ht 69.0 in | Wt 210.0 lb

## 2021-11-15 DIAGNOSIS — Z86010 Personal history of colonic polyps: Secondary | ICD-10-CM

## 2021-11-15 MED ORDER — NA SULFATE-K SULFATE-MG SULF 17.5-3.13-1.6 GM/177ML PO SOLN: 1.0000 | Freq: Once | ORAL | 0 refills | Status: AC

## 2021-11-15 NOTE — Progress Notes (Signed)

## 2021-11-16 ENCOUNTER — Other Ambulatory Visit: Payer: Self-pay | Admitting: Internal Medicine

## 2021-11-16 DIAGNOSIS — G8929 Other chronic pain: Secondary | ICD-10-CM

## 2021-11-20 ENCOUNTER — Encounter: Payer: Self-pay | Admitting: Internal Medicine

## 2021-11-20 NOTE — Telephone Encounter (Signed)
Last filled 09/13/21 last ov 10/24/21 okay to fill Tramadol.

## 2021-11-26 ENCOUNTER — Encounter: Payer: Self-pay | Admitting: Internal Medicine

## 2021-11-29 ENCOUNTER — Ambulatory Visit (AMBULATORY_SURGERY_CENTER): Payer: BC Managed Care – PPO | Admitting: Internal Medicine

## 2021-11-29 ENCOUNTER — Other Ambulatory Visit: Payer: Self-pay

## 2021-11-29 ENCOUNTER — Encounter: Payer: Self-pay | Admitting: Internal Medicine

## 2021-11-29 VITALS — BP 114/75 | HR 55 | Temp 97.4°F | Resp 13 | Ht 69.0 in | Wt 210.0 lb

## 2021-11-29 DIAGNOSIS — Z86010 Personal history of colonic polyps: Secondary | ICD-10-CM

## 2021-11-29 MED ORDER — SODIUM CHLORIDE 0.9 % IV SOLN: 500.0000 mL | Freq: Once | INTRAVENOUS | Status: DC
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
## 2021-11-29 NOTE — Progress Notes (Signed)
Pt's states no medical or surgical changes since previsit or office visit. 

## 2021-11-29 NOTE — Op Note (Signed)
[place] Patient Name: Austin [name] Procedure Date: 11/29/2021 8:54 AM MRN: [id number] Endoscopist: Jerene Bears , MD Age: 56 Referring MD:  Date of Birth: 09-20-1966 Gender: Male Account #: [id number] Procedure:                Colonoscopy Indications:              High risk colon cancer surveillance: Personal                            history of non-advanced adenoma, Last colonoscopy:                            October 2017 Medicines:                Monitored Anesthesia Care Procedure:                Pre-Anesthesia Assessment:                           - Prior to the procedure, a History and Physical                            was performed, and patient medications and                            allergies were reviewed. The patient's tolerance of                            previous anesthesia was also reviewed. The risks                            and benefits of the procedure and the sedation                            options and risks were discussed with the patient.                            All questions were answered, and informed consent                            was obtained. Prior Anticoagulants: The patient has                            taken no previous anticoagulant or antiplatelet                            agents. ASA Grade Assessment: II - A patient with                            mild systemic disease. After reviewing the risks                            and benefits, the patient was deemed in  satisfactory condition to undergo the procedure.                           After obtaining informed consent, the colonoscope                            was passed under direct vision. Throughout the                            procedure, the patient's blood pressure, pulse, and                            oxygen saturations were monitored continuously. The                            CF [id number] #[id number] was introduced through the anus                             and advanced to the cecum, identified by                            appendiceal orifice and ileocecal valve. The                            colonoscopy was performed without difficulty. The                            patient tolerated the procedure well. The quality                            of the bowel preparation was good. The ileocecal                            valve, appendiceal orifice, and rectum were                            photographed. Scope In: 9:02:46 AM Scope Out: 9:13:27 AM Scope Withdrawal Time: 0 hours 9 minutes 1 second  Total Procedure Duration: 0 hours 10 minutes 41 seconds  Findings:                 The digital rectal exam was normal.                           Multiple small and large-mouthed diverticula were                            found in the sigmoid colon and distal descending                            colon.                           Internal hemorrhoids were found during  retroflexion. The hemorrhoids were small.                           The exam was otherwise without abnormality. Complications:            No immediate complications. Estimated Blood Loss:     Estimated blood loss: none. Impression:               - Diverticulosis in the sigmoid colon and in the                            distal descending colon.                           - Small internal hemorrhoids.                           - The examination was otherwise normal.                           - No specimens collected. Recommendation:           - Patient has a contact number available for                            emergencies. The signs and symptoms of potential                            delayed complications were discussed with the                            patient. Return to normal activities tomorrow.                            Written discharge instructions were provided to the                            patient.                            - Resume previous diet.                           - Continue present medications.                           - Repeat colonoscopy in 10 years for surveillance. Jerene Bears, MD 11/29/2021 9:15:26 AM This report has been signed electronically.

## 2021-11-29 NOTE — Progress Notes (Signed)
C.W. vital signs. 

## 2021-11-29 NOTE — Patient Instructions (Signed)
YOU HAD AN ENDOSCOPIC PROCEDURE TODAY AT [facility]:   Refer to the procedure report that was given to you for any specific questions about what was found during the examination.  If the procedure report does not answer your questions, please call your gastroenterologist to clarify.  If you requested that your care partner not be given the details of your procedure findings, then the procedure report has been included in a sealed envelope for you to review at your convenience later.  **handouts given on hemorrhoids and diverticulosis**  YOU SHOULD EXPECT: Some feelings of bloating in the abdomen. Passage of more gas than usual.  Walking can help get rid of the air that was put into your GI tract during the procedure and reduce the bloating. If you had a lower endoscopy (such as a colonoscopy or flexible sigmoidoscopy) you may notice spotting of blood in your stool or on the toilet paper. If you underwent a bowel prep for your procedure, you may not have a normal bowel movement for a few days.  Please Note:  You might notice some irritation and congestion in your nose or some drainage.  This is from the oxygen used during your procedure.  There is no need for concern and it should clear up in a day or so.  SYMPTOMS TO REPORT IMMEDIATELY:  Following lower endoscopy (colonoscopy or flexible sigmoidoscopy):  Excessive amounts of blood in the stool  Significant tenderness or worsening of abdominal pains  Swelling of the abdomen that is new, acute  Fever of 100F or higher   For urgent or emergent issues, a gastroenterologist can be reached at any hour by calling [phone]. Do not use MyChart messaging for urgent concerns.    DIET:  We do recommend a small meal at first, but then you may proceed to your regular diet.  Drink plenty of fluids but you should avoid alcoholic beverages for 24 hours.  ACTIVITY:  You should plan to take it easy for the rest of today and you should  NOT DRIVE or use heavy machinery until tomorrow (because of the sedation medicines used during the test).    FOLLOW UP: Our staff will call the number listed on your records 48-72 hours following your procedure to check on you and address any questions or concerns that you may have regarding the information given to you following your procedure. If we do not reach you, we will leave a message.  We will attempt to reach you two times.  During this call, we will ask if you have developed any symptoms of COVID 19. If you develop any symptoms (ie: fever, flu-like symptoms, shortness of breath, cough etc.) before then, please call [phone].  If you test positive for Covid 19 in the 2 weeks post procedure, please call and report this information to [place].    If any biopsies were taken you will be contacted by phone or by letter within the next 1-3 weeks.  Please call us at [phone] if you have not heard about the biopsies in 3 weeks.    SIGNATURES/CONFIDENTIALITY: You and/or your care partner have signed paperwork which will be entered into your electronic medical record.  These signatures attest to the fact that that the information above on your After Visit Summary has been reviewed and is understood.  Full responsibility of the confidentiality of this discharge information lies with you and/or your care-partner.

## 2021-11-29 NOTE — Progress Notes (Signed)
Report given to PACU, vss 

## 2021-12-01 ENCOUNTER — Other Ambulatory Visit: Payer: Self-pay | Admitting: Family Medicine

## 2021-12-03 ENCOUNTER — Telehealth: Payer: Self-pay

## 2021-12-03 NOTE — Telephone Encounter (Signed)
Left message on follow up call. 

## 2021-12-03 NOTE — Telephone Encounter (Signed)
°  Follow up Call-  Call back number 11/29/2021  Post procedure Call Back phone  # [phone]  Permission to leave phone message Yes  Some recent data might be hidden     Patient questions:  Do you have a fever, pain , or abdominal swelling? No Pain Score  0 *  Have you tolerated food without any problems? Yes.    Have you been able to return to your normal activities? Yes.    Do you have any questions about your discharge instructions: Diet   No. Medications  No. Follow up visit  No.  Do you have questions or concerns about your Care? No.  Actions: * If pain score is 4 or above: No action needed, pain <4.

## 2022-01-14 ENCOUNTER — Other Ambulatory Visit: Payer: Self-pay

## 2022-01-14 ENCOUNTER — Encounter: Payer: Self-pay | Admitting: Dermatology

## 2022-01-14 ENCOUNTER — Ambulatory Visit: Payer: BC Managed Care – PPO | Admitting: Dermatology

## 2022-01-14 DIAGNOSIS — R238 Other skin changes: Secondary | ICD-10-CM | POA: Diagnosis not present

## 2022-01-14 DIAGNOSIS — L578 Other skin changes due to chronic exposure to nonionizing radiation: Secondary | ICD-10-CM | POA: Diagnosis not present

## 2022-01-14 NOTE — Patient Instructions (Signed)
Recommend daily broad spectrum sunscreen SPF 30+ to sun-exposed areas, reapply every 2 hours as needed. Call for new or changing lesions.  ?Staying in the shade or wearing long sleeves, sun glasses (UVA+UVB protection) and wide brim hats (4-inch brim around the entire circumference of the hat) are also recommended for sun protection.  ? ? ?If You Need Anything After Your Visit ? ?If you have any questions or concerns for your doctor, please call our main line at [phone] and press option 4 to reach your doctor's medical assistant. If no one answers, please leave a voicemail as directed and we will return your call as soon as possible. Messages left after 4 pm will be answered the following business day.  ? ?You may also send [place] a message via MyChart. We typically respond to MyChart messages within 1-2 business days. ? ?For prescription refills, please ask your pharmacy to contact our office. Our fax number is [phone]. ? ?If you have an urgent issue when the clinic is closed that cannot wait until the next business day, you can page your doctor at the number below.   ? ?Please note that while we do our best to be available for urgent issues outside of office hours, we are not available 24/7.  ? ?If you have an urgent issue and are unable to reach [place], you may choose to seek medical care at your doctor's office, retail clinic, urgent care center, or emergency room. ? ?If you have a medical emergency, please immediately call 911 or go to the emergency department. ? ?Pager Numbers ? ?- Dr. Nehemiah Massed: [phone] ? ?- Dr. Laurence Ferrari: [phone] ? ?- Dr. Nicole Kindred: [phone] ? ?In the event of inclement weather, please call our main line at [phone] for an update on the status of any delays or closures. ? ?Dermatology Medication Tips: ?Please keep the boxes that topical medications come in in order to help keep track of the instructions about where and how to use these. Pharmacies typically print the medication  instructions only on the boxes and not directly on the medication tubes.  ? ?If your medication is too expensive, please contact our office at [phone] option 4 or send [place] a message through [place].  ? ?We are unable to tell what your co-pay for medications will be in advance as this is different depending on your insurance coverage. However, we may be able to find a substitute medication at lower cost or fill out paperwork to get insurance to cover a needed medication.  ? ?If a prior authorization is required to get your medication covered by your insurance company, please allow [place] 1-2 business days to complete this process. ? ?Drug prices often vary depending on where the prescription is filled and some pharmacies may offer cheaper prices. ? ?The website www.goodrx.com contains coupons for medications through different pharmacies. The prices here do not account for what the cost may be with help from insurance (it may be cheaper with your insurance), but the website can give you the price if you did not use any insurance.  ?- You can print the associated coupon and take it with your prescription to the pharmacy.  ?- You may also stop by our office during regular business hours and pick up a GoodRx coupon card.  ?- If you need your prescription sent electronically to a different pharmacy, notify our office through [facility] or by phone at [phone] option 4. ? ? ? ? ?Si Usted Necesita Algo Despu?s de Su  Visita ? ?Tambi?n puede enviarnos un mensaje a trav?s de MyChart. Por lo general respondemos a los mensajes de MyChart en el transcurso de 1 a 2 d?as h?biles. ? ?Para renovar recetas, por favor pida a su farmacia que se ponga en contacto con nuestra oficina. Nuestro n?mero de fax es el [phone]. ? ?Si tiene un asunto urgente cuando la cl?nica est? cerrada y que no puede esperar hasta el siguiente d?a h?bil, puede llamar/localizar a su doctor(a) al n?mero que aparece a continuaci?n.  ? ?Por  favor, tenga en cuenta que aunque hacemos todo lo posible para estar disponibles para asuntos urgentes fuera del horario de oficina, no estamos disponibles las 24 horas del d?a, los 7 d?as de la semana.  ? ?Si tiene un problema urgente y no puede comunicarse con nosotros, puede optar por buscar atenci?n m?dica  en el consultorio de su doctor(a), en una cl?nica privada, en un centro de atenci?n urgente o en una sala de emergencias. ? ?Si tiene Engineer, maintenance (IT) m?dica, por favor llame inmediatamente al 911 o vaya a la sala de emergencias. ? ?N?meros de b?per ? ?- Dr. Nehemiah Massed: [phone] ? ?- Dra. Moye: [phone] ? ?- Dra. Nicole Kindred: [phone] ? ?En caso de inclemencias del tiempo, por favor llame a nuestra l?nea principal al [phone] para una actualizaci?n sobre el estado de cualquier retraso o cierre. ? ?Consejos para la medicaci?n en dermatolog?a: ?Por favor, guarde las cajas en las que vienen los medicamentos de uso t?pico para ayudarle a seguir las instrucciones sobre d?nde y c?mo usarlos. Las farmacias generalmente imprimen las instrucciones del medicamento s?lo en las cajas y no directamente en los tubos del [place].  ? ?Si su medicamento es muy caro, por favor, p?ngase en contacto con Zigmund Daniel llamando al [phone] y presione la opci?n 4 o env?enos un mensaje a trav?s de MyChart.  ? ?No podemos decirle cu?l ser? su copago por los medicamentos por adelantado ya que esto es diferente dependiendo de la cobertura de su seguro. Sin embargo, es posible que podamos encontrar un medicamento sustituto a Electrical engineer un formulario para que el seguro cubra el medicamento que se considera necesario.  ? ?Si se requiere Ardelia Mems autorizaci?n previa para que su compa??a de seguros [place] su medicamento, por favor perm?tanos de 1 a 2 d?as h?biles para completar este proceso. ? ?Los precios de los medicamentos var?an con frecuencia dependiendo del Environmental consultant de d?nde se surte la receta y alguna farmacias  pueden ofrecer precios m?s baratos. ? ?El sitio web www.goodrx.com tiene cupones para medicamentos de Airline pilot. Los precios aqu? no tienen en cuenta lo que podr?a costar con la ayuda del seguro (puede ser m?s barato con su seguro), pero el sitio web puede darle el precio si no utiliz? ning?n seguro.  ?- Puede imprimir el cup?n correspondiente y llevarlo con su receta a la farmacia.  ?- Tambi?n puede pasar por nuestra oficina durante el horario de atenci?n regular y recoger una tarjeta de cupones de GoodRx.  ?- Si necesita que su receta se env?e electr?nicamente a Chiropodist, informe a nuestra oficina a trav?s de MyChart de [place] o por tel?fono llamando al [phone] y presione la opci?n 4.  ?

## 2022-01-14 NOTE — Progress Notes (Signed)
? ?  Follow-Up Visit ?  ?Subjective  ?Austin [name] is a 56 y.o. male who presents for the following: lesion (Left lower lip. Dur: 7 months. Thought was blood blister but has not resolved. Non tender, denies itching). ?The patient has spots, moles and lesions to be evaluated, some may be new or changing and the patient has concerns that these could be cancer. ? ?The following portions of the chart were reviewed this encounter and updated as appropriate:  Tobacco  Allergies  Meds  Problems  Med Hx  Surg Hx  Fam Hx   ?  ?Review of Systems: No other skin or systemic complaints except as noted in HPI or Assessment and Plan. ? ?Objective  ?Well appearing patient in no apparent distress; mood and affect are within normal limits. ? ?A focused examination was performed including head, including the scalp, face, neck, nose, ears, eyelids, and lips. Relevant physical exam findings are noted in the Assessment and Plan. ? ?Left Lower [place] Lip ?Violaceous blanchable macule. 0.3 cm x 0.4 cm ? ? ? ? ? ?Assessment & Plan  ?Venous lake ?Left Lower [place] Lip ?May be sun exposure or trauma related. ?Benign-appearing.  Observation.  Call clinic for new or changing lesions.  Recommend daily use of broad spectrum spf 30+ sunscreen to sun-exposed areas.   ? ?Can be treated with laser in our office. $200 per treatment session, can take more than one treatment.  ? ?Actinic Damage ?- chronic, secondary to cumulative UV radiation exposure/sun exposure over time ?- diffuse scaly erythematous macules with underlying dyspigmentation ?- Recommend daily broad spectrum sunscreen SPF 30+ to sun-exposed areas, reapply every 2 hours as needed.  ?- Recommend staying in the shade or wearing long sleeves, sun glasses (UVA+UVB protection) and wide brim hats (4-inch brim around the entire circumference of the hat). ?- Call for new or changing lesions. ? ?Return if symptoms worsen or fail to improve. ? ?I, Emelia Salisbury, CMA, am acting as  scribe for Sarina Ser, MD. ?Documentation: I have reviewed the above documentation for accuracy and completeness, and I agree with the above. ? ?Sarina Ser, MD ? ? ?

## 2022-01-15 ENCOUNTER — Other Ambulatory Visit: Payer: Self-pay | Admitting: Family Medicine

## 2022-01-15 DIAGNOSIS — M542 Cervicalgia: Secondary | ICD-10-CM

## 2022-01-15 DIAGNOSIS — G8929 Other chronic pain: Secondary | ICD-10-CM

## 2022-01-25 ENCOUNTER — Ambulatory Visit: Payer: BC Managed Care – PPO | Admitting: Family Medicine

## 2022-01-25 ENCOUNTER — Encounter: Payer: Self-pay | Admitting: Family Medicine

## 2022-01-25 ENCOUNTER — Other Ambulatory Visit: Payer: Self-pay

## 2022-01-25 DIAGNOSIS — G8929 Other chronic pain: Secondary | ICD-10-CM

## 2022-01-25 DIAGNOSIS — E041 Nontoxic single thyroid nodule: Secondary | ICD-10-CM | POA: Diagnosis not present

## 2022-01-25 DIAGNOSIS — E669 Obesity, unspecified: Secondary | ICD-10-CM

## 2022-01-25 DIAGNOSIS — F909 Attention-deficit hyperactivity disorder, unspecified type: Secondary | ICD-10-CM | POA: Diagnosis not present

## 2022-01-25 DIAGNOSIS — M542 Cervicalgia: Secondary | ICD-10-CM

## 2022-01-25 DIAGNOSIS — I10 Essential (primary) hypertension: Secondary | ICD-10-CM

## 2022-01-25 MED ORDER — LISDEXAMFETAMINE DIMESYLATE 40 MG PO CAPS: 40.0000 mg | ORAL_CAPSULE | Freq: Every day | ORAL | 0 refills | Status: DC

## 2022-01-25 MED ORDER — TRAMADOL HCL 50 MG PO TABS: 50.0000 mg | ORAL_TABLET | Freq: Every day | ORAL | 0 refills | Status: DC | PRN

## 2022-01-25 MED ORDER — LISDEXAMFETAMINE DIMESYLATE 40 MG PO CAPS: 40.0000 mg | ORAL_CAPSULE | ORAL | 0 refills | Status: DC

## 2022-01-25 NOTE — Assessment & Plan Note (Addendum)
Reports being released by ENT. ?

## 2022-01-25 NOTE — Assessment & Plan Note (Signed)
Well-controlled.  He will continue carvedilol 12.5 mg twice daily and lisinopril 40 mg daily. ?

## 2022-01-25 NOTE — Progress Notes (Signed)
?Tommi Rumps, MD ?Phone: [phone] ? ?Austin [name] is a 56 y.o. male who presents today for f/u. ? ?HYPERTENSION ?Disease Monitoring ?Home BP Monitoring not checking Chest pain- no    Dyspnea- no ?Medications ?Compliance-  taking coreg, lisinopril.  Edema- no ?BMET ?   ?Component Value Date/Time  ? NA 142 06/15/2021 0928  ? K 4.4 06/15/2021 0928  ? CL 103 06/15/2021 0928  ? CO2 23 06/15/2021 0928  ? GLUCOSE 100 (H) 06/15/2021 7014  ? GLUCOSE 88 08/04/2018 1636  ? BUN 15 06/15/2021 0928  ? CREATININE 0.89 06/15/2021 0928  ? CALCIUM 9.1 06/15/2021 0928  ? GFRNONAA 87 12/11/2020 1502  ? GFRAA 101 12/11/2020 1502  ? ?ADHD ?Medication: vyvanse ?Effectiveness: yes ?Palpitations: no ?Sleep difficulty: no ?Appetite suppression: no ? ?Thyroid nodule: patient reported this ended up being a cyst and ENT advised no further follow-up needed. The last [place] report indicates the nodule was smaller than previously indicating likely benignity. ? ?Chronic pain: takes tramadol for chronic neck pain. This is helpful. No excessive drowsiness with this.  ? ?Exercises by walking when he can.  Diet is variable though he is getting fruits and vegetables.  He Flexocrepe chicken.  Does drink 2-3 sweet teas per day though that is down significantly from previously. ? ?Social History  ? ?Tobacco Use  ?Smoking Status Former  ? Packs/day: 1.00  ? Years: 30.00  ? Pack years: 30.00  ? Types: Cigarettes  ? Quit date: 01/10/2012  ? Years since quitting: 10.0  ?Smokeless Tobacco Never  ? ? ?Current Outpatient Medications on File Prior to Visit  ?Medication Sig Dispense Refill  ? baclofen (LIORESAL) 10 MG tablet TAKE 1 TABLET BY MOUTH AT BEDTIME AS NEEDED FOR MUSCLE SPASMS. 90 tablet 1  ? carvedilol (COREG) 12.5 MG tablet TAKE 1 TABLET (12.5 MG TOTAL) BY MOUTH 2 (TWO) TIMES DAILY WITH A MEAL. 180 tablet 1  ? lisinopril (ZESTRIL) 40 MG tablet TAKE 1 TABLET BY MOUTH EVERY DAY 90 tablet 1  ? meloxicam (MOBIC) 15 MG tablet TAKE 1 TABLET (15 MG TOTAL) BY  MOUTH DAILY. 90 tablet 1  ? omeprazole (PRILOSEC) 20 MG capsule TAKE 1 CAPSULE BY MOUTH EVERY DAY 90 capsule 1  ? sertraline (ZOLOFT) 50 MG tablet TAKE 1 TABLET BY MOUTH EVERY DAY 90 tablet 1  ? ?No current facility-administered medications on file prior to visit.  ? ? ? ?ROS see history of present illness ? ?Objective ? ?Physical Exam ?Vitals:  ? 01/25/22 1413  ?BP: 100/60  ?Pulse: 63  ?Temp: 98 ?F (36.7 ?C)  ?SpO2: 100%  ? ? ?BP Readings from Last 3 Encounters:  ?01/25/22 100/60  ?11/29/21 114/75  ?10/24/21 119/80  ? ?Wt Readings from Last 3 Encounters:  ?01/25/22 213 lb (96.6 kg)  ?11/29/21 210 lb (95.3 kg)  ?11/15/21 210 lb (95.3 kg)  ? ? ?Physical Exam ?Constitutional:   ?   General: He is not in acute distress. ?   Appearance: He is not diaphoretic.  ?Cardiovascular:  ?   Rate and Rhythm: Normal rate and regular rhythm.  ?   Heart sounds: Normal heart sounds.  ?Pulmonary:  ?   Effort: Pulmonary effort is normal.  ?   Breath sounds: Normal breath sounds.  ?Skin: ?   General: Skin is warm and dry.  ?Neurological:  ?   Mental Status: He is alert.  ? ? ? ?Assessment/Plan: Please see individual problem list. ? ?Problem List Items Addressed This Visit   ? ?  ADHD  ?  Continue Vyvanse 40 mg once daily.  Refill provided.  Controlled substance database reviewed. ?  ?  ? Relevant Medications  ? lisdexamfetamine (VYVANSE) 40 MG capsule (Start on 03/27/2022)  ? lisdexamfetamine (VYVANSE) 40 MG capsule (Start on 02/25/2022)  ? lisdexamfetamine (VYVANSE) 40 MG capsule  ? Chronic neck pain  ?  Stable.  He can continue tramadol 50-100 mg daily as needed. ?  ?  ? Relevant Medications  ? traMADol (ULTRAM) 50 MG tablet  ? Hypertension  ?  Well-controlled.  He will continue carvedilol 12.5 mg twice daily and lisinopril 40 mg daily. ?  ?  ? Relevant Orders  ? Basic Metabolic Panel (BMET)  ? Obesity (BMI 30-39.9)  ?  Encouraged healthy diet and continued activity. ?  ?  ? Relevant Medications  ? lisdexamfetamine (VYVANSE) 40 MG  capsule (Start on 03/27/2022)  ? lisdexamfetamine (VYVANSE) 40 MG capsule (Start on 02/25/2022)  ? lisdexamfetamine (VYVANSE) 40 MG capsule  ? Thyroid nodule  ?  Reports being released by ENT. ?  ?  ? ? ?Return in about 3 months (around 04/27/2022) for ADHD. ? ?This visit occurred during the SARS-CoV-2 public health emergency.  Safety protocols were in place, including screening questions prior to the visit, additional usage of staff PPE, and extensive cleaning of exam room while observing appropriate contact time as indicated for disinfecting solutions.  ? ? ?Tommi Rumps, MD ?[place] ? ?

## 2022-01-25 NOTE — Patient Instructions (Signed)
Nice to see you. ?I refilled your medications. ?We will contact you with your lab results. ?

## 2022-01-25 NOTE — Assessment & Plan Note (Signed)
Continue Vyvanse 40 mg once daily.  Refill provided.  Controlled substance database reviewed. ?

## 2022-01-25 NOTE — Assessment & Plan Note (Signed)
Encouraged healthy diet and continued activity. ?

## 2022-01-25 NOTE — Assessment & Plan Note (Signed)
Stable.  He can continue tramadol 50-100 mg daily as needed. ?

## 2022-01-26 LAB — BASIC METABOLIC PANEL
BUN/Creatinine Ratio: 9 (ref 9–20)
BUN: 9 mg/dL (ref 6–24)
CO2: 24 mmol/L (ref 20–29)
Calcium: 9 mg/dL (ref 8.7–10.2)
Chloride: 102 mmol/L (ref 96–106)
Creatinine, Ser: 0.95 mg/dL (ref 0.76–1.27)
Glucose: 83 mg/dL (ref 70–99)
Potassium: 4.2 mmol/L (ref 3.5–5.2)
Sodium: 141 mmol/L (ref 134–144)
eGFR: 95 mL/min/{1.73_m2} (ref >59)

## 2022-02-28 ENCOUNTER — Other Ambulatory Visit: Payer: Self-pay | Admitting: Family Medicine

## 2022-02-28 DIAGNOSIS — G8929 Other chronic pain: Secondary | ICD-10-CM

## 2022-02-28 DIAGNOSIS — I10 Essential (primary) hypertension: Secondary | ICD-10-CM

## 2022-03-24 ENCOUNTER — Other Ambulatory Visit: Payer: Self-pay | Admitting: Family Medicine

## 2022-03-24 DIAGNOSIS — F909 Attention-deficit hyperactivity disorder, unspecified type: Secondary | ICD-10-CM

## 2022-03-25 ENCOUNTER — Other Ambulatory Visit: Payer: Self-pay

## 2022-05-03 ENCOUNTER — Encounter: Payer: Self-pay | Admitting: Family Medicine

## 2022-05-03 ENCOUNTER — Ambulatory Visit: Payer: BC Managed Care – PPO | Admitting: Family Medicine

## 2022-05-03 VITALS — BP 90/60 | HR 64 | Temp 97.7°F | Ht 69.0 in | Wt 211.6 lb

## 2022-05-03 DIAGNOSIS — F411 Generalized anxiety disorder: Secondary | ICD-10-CM

## 2022-05-03 DIAGNOSIS — R519 Headache, unspecified: Secondary | ICD-10-CM | POA: Insufficient documentation

## 2022-05-03 DIAGNOSIS — I10 Essential (primary) hypertension: Secondary | ICD-10-CM | POA: Diagnosis not present

## 2022-05-03 DIAGNOSIS — F909 Attention-deficit hyperactivity disorder, unspecified type: Secondary | ICD-10-CM

## 2022-05-03 DIAGNOSIS — Z8719 Personal history of other diseases of the digestive system: Secondary | ICD-10-CM | POA: Insufficient documentation

## 2022-05-03 MED ORDER — LISDEXAMFETAMINE DIMESYLATE 40 MG PO CAPS: 40.0000 mg | ORAL_CAPSULE | Freq: Every day | ORAL | 0 refills | Status: DC

## 2022-05-03 MED ORDER — LISDEXAMFETAMINE DIMESYLATE 40 MG PO CAPS: 40.0000 mg | ORAL_CAPSULE | ORAL | 0 refills | Status: DC

## 2022-05-03 MED ORDER — SERTRALINE HCL 50 MG PO TABS: 25.0000 mg | ORAL_TABLET | Freq: Every day | ORAL | Status: DC

## 2022-05-13 ENCOUNTER — Encounter: Payer: Self-pay | Admitting: Family Medicine

## 2022-05-21 ENCOUNTER — Other Ambulatory Visit: Payer: Self-pay | Admitting: Family Medicine

## 2022-05-21 DIAGNOSIS — G8929 Other chronic pain: Secondary | ICD-10-CM

## 2022-06-05 ENCOUNTER — Other Ambulatory Visit: Payer: Self-pay | Admitting: Family Medicine

## 2022-06-05 ENCOUNTER — Encounter: Payer: Self-pay | Admitting: Family Medicine

## 2022-06-05 DIAGNOSIS — F411 Generalized anxiety disorder: Secondary | ICD-10-CM

## 2022-07-01 ENCOUNTER — Encounter: Payer: Self-pay | Admitting: Family Medicine

## 2022-07-21 ENCOUNTER — Other Ambulatory Visit: Payer: Self-pay | Admitting: Family Medicine

## 2022-07-21 DIAGNOSIS — G8929 Other chronic pain: Secondary | ICD-10-CM

## 2022-07-23 ENCOUNTER — Other Ambulatory Visit: Payer: Self-pay | Admitting: Family Medicine

## 2022-07-23 DIAGNOSIS — G8929 Other chronic pain: Secondary | ICD-10-CM

## 2022-07-23 DIAGNOSIS — I10 Essential (primary) hypertension: Secondary | ICD-10-CM

## 2022-07-24 ENCOUNTER — Encounter: Payer: Self-pay | Admitting: Family Medicine

## 2022-07-29 ENCOUNTER — Other Ambulatory Visit: Payer: Self-pay | Admitting: Family

## 2022-07-29 DIAGNOSIS — G8929 Other chronic pain: Secondary | ICD-10-CM

## 2022-07-30 ENCOUNTER — Encounter: Payer: Self-pay | Admitting: Family Medicine

## 2022-07-30 DIAGNOSIS — G8929 Other chronic pain: Secondary | ICD-10-CM

## 2022-07-30 DIAGNOSIS — K219 Gastro-esophageal reflux disease without esophagitis: Secondary | ICD-10-CM

## 2022-07-31 MED ORDER — OMEPRAZOLE 20 MG PO CPDR: DELAYED_RELEASE_CAPSULE | ORAL | 3 refills | Status: DC

## 2022-07-31 MED ORDER — MELOXICAM 15 MG PO TABS: 15.0000 mg | ORAL_TABLET | Freq: Every day | ORAL | 1 refills | Status: DC

## 2022-07-31 NOTE — Addendum Note (Signed)
Addended by: Caryl Bis, Neville Pauls G on: 07/31/2022 10:01 AM   Modules accepted: Orders

## 2022-08-12 ENCOUNTER — Ambulatory Visit: Payer: BC Managed Care – PPO | Admitting: Family Medicine

## 2022-08-12 DIAGNOSIS — I10 Essential (primary) hypertension: Secondary | ICD-10-CM | POA: Diagnosis not present

## 2022-08-12 DIAGNOSIS — S93699A Other sprain of unspecified foot, initial encounter: Secondary | ICD-10-CM

## 2022-08-12 DIAGNOSIS — F909 Attention-deficit hyperactivity disorder, unspecified type: Secondary | ICD-10-CM | POA: Diagnosis not present

## 2022-08-12 MED ORDER — LISDEXAMFETAMINE DIMESYLATE 40 MG PO CAPS: 40.0000 mg | ORAL_CAPSULE | ORAL | 0 refills | Status: DC

## 2022-08-12 MED ORDER — LISDEXAMFETAMINE DIMESYLATE 40 MG PO CAPS
40.0000 mg | ORAL_CAPSULE | ORAL | 0 refills | Status: DC
Start: 2022-11-08 — End: 2023-03-10

## 2022-08-12 NOTE — Assessment & Plan Note (Signed)
Adequately controlled.  He will continue carvedilol 12.5 mg twice daily and lisinopril 40 mg daily.  Check labs.

## 2022-08-12 NOTE — Assessment & Plan Note (Signed)
Well-controlled.  Refill Vyvanse 40 mg daily.

## 2022-08-12 NOTE — Assessment & Plan Note (Signed)
Has been recovering adequately per his report.  If he has any worsening symptoms he will follow-up with orthopedics.

## 2022-08-12 NOTE — Progress Notes (Signed)
Tommi Rumps, MD Phone: [phone]  Austin [name] is a 56 y.o. male who presents today for f/u.  HYPERTENSION Disease Monitoring Home BP Monitoring notes it was previously good though has not checked in some time.  chest pain- no    Dyspnea- no Medications Compliance-  taking coreg, lisinopril.   Edema- no BMET    Component Value Date/Time   NA 141 01/25/2022 1439   K 4.2 01/25/2022 1439   CL 102 01/25/2022 1439   CO2 24 01/25/2022 1439   GLUCOSE 83 01/25/2022 1439   GLUCOSE 88 08/04/2018 1636   BUN 9 01/25/2022 1439   CREATININE 0.95 01/25/2022 1439   CALCIUM 9.0 01/25/2022 1439   GFRNONAA 87 12/11/2020 1502   GFRAA 101 12/11/2020 1502   ADHD Medication: vyvanse  Effectiveness: yes Palpitations: no Sleep difficulty: no Appetite suppression: no  Ruptured plantar fascia: Patient notes he stepped in notes this ruptured.  He saw orthopedics and they advised there was not much to do.  They noted he would recover over the next few months.  At this point he just feels like a bruised heel and he has occasional shooting pains.   Social History   Tobacco Use  Smoking Status Former   Packs/day: 1.00   Years: 30.00   Total pack years: 30.00   Types: Cigarettes   Quit date: 01/10/2012   Years since quitting: 10.5  Smokeless Tobacco Never    Current Outpatient Medications on File Prior to Visit  Medication Sig Dispense Refill   baclofen (LIORESAL) 10 MG tablet TAKE 1 TABLET BY MOUTH AT BEDTIME AS NEEDED FOR MUSCLE SPASMS. 90 tablet 1   carvedilol (COREG) 12.5 MG tablet TAKE 1 TABLET (12.5MG TOTAL) BY MOUTH TWICE A DAY WITH MEALS 180 tablet 1   lisinopril (ZESTRIL) 40 MG tablet TAKE 1 TABLET BY MOUTH EVERY DAY 90 tablet 1   meloxicam (MOBIC) 15 MG tablet Take 1 tablet (15 mg total) by mouth daily. 90 tablet 1   omeprazole (PRILOSEC) 20 MG capsule TAKE 1 CAPSULE BY MOUTH EVERY DAY 90 capsule 3   sertraline (ZOLOFT) 50 MG tablet TAKE 1 TABLET BY MOUTH EVERY DAY 90 tablet 1    traMADol (ULTRAM) 50 MG tablet TAKE 1-2 TABLETS (50-100 MG TOTAL) BY MOUTH DAILY AS NEEDED FOR MODERATE PAIN. 60 tablet 0   No current facility-administered medications on file prior to visit.     ROS see history of present illness  Objective  Physical Exam Vitals:   08/12/22 1504  BP: 120/70  Pulse: 64  Temp: 98 F (36.7 C)  SpO2: 98%    BP Readings from Last 3 Encounters:  08/12/22 120/70  05/03/22 90/60  01/25/22 100/60   Wt Readings from Last 3 Encounters:  08/12/22 213 lb 12.8 oz (97 kg)  05/03/22 211 lb 9.6 oz (96 kg)  01/25/22 213 lb (96.6 kg)    Physical Exam Constitutional:      General: He is not in acute distress.    Appearance: He is not diaphoretic.  Cardiovascular:     Rate and Rhythm: Normal rate and regular rhythm.     Heart sounds: Normal heart sounds.  Pulmonary:     Effort: Pulmonary effort is normal.     Breath sounds: Normal breath sounds.  Skin:    General: Skin is warm and dry.  Neurological:     Mental Status: He is alert.      Assessment/Plan: Please see individual problem list.  Problem List Items Addressed  This Visit     ADHD (Chronic)    Well-controlled.  Refill Vyvanse 40 mg daily.      Relevant Medications   lisdexamfetamine (VYVANSE) 40 MG capsule (Start on 11/08/2022)   lisdexamfetamine (VYVANSE) 40 MG capsule (Start on 10/09/2022)   lisdexamfetamine (VYVANSE) 40 MG capsule (Start on 09/08/2022)   Hypertension (Chronic)    Adequately controlled.  He will continue carvedilol 12.5 mg twice daily and lisinopril 40 mg daily.  Check labs.      Relevant Orders   Comp Met (CMET)   HgB A1c   Lipid panel   Plantar fascia rupture    Has been recovering adequately per his report.  If he has any worsening symptoms he will follow-up with orthopedics.        Health Maintenance: Patient declines flu vaccine and tetanus vaccine.  Return in about 3 months (around 11/12/2022) for adhd.   Tommi Rumps, MD [place]

## 2022-08-12 NOTE — Patient Instructions (Signed)
Nice to see you. We will get lab work today and contact you with results. 

## 2022-08-13 LAB — COMPREHENSIVE METABOLIC PANEL
ALT: 17 IU/L (ref 0–44)
AST: 21 IU/L (ref 0–40)
Albumin/Globulin Ratio: 2.4 — ABNORMAL HIGH (ref 1.2–2.2)
Albumin: 4.6 g/dL (ref 3.8–4.9)
Alkaline Phosphatase: 90 IU/L (ref 44–121)
BUN/Creatinine Ratio: 15 (ref 9–20)
BUN: 11 mg/dL (ref 6–24)
Bilirubin Total: 0.4 mg/dL (ref 0.0–1.2)
CO2: 25 mmol/L (ref 20–29)
Calcium: 9.2 mg/dL (ref 8.7–10.2)
Chloride: 103 mmol/L (ref 96–106)
Creatinine, Ser: 0.73 mg/dL — ABNORMAL LOW (ref 0.76–1.27)
Globulin, Total: 1.9 g/dL (ref 1.5–4.5)
Glucose: 95 mg/dL (ref 70–99)
Potassium: 3.8 mmol/L (ref 3.5–5.2)
Sodium: 141 mmol/L (ref 134–144)
Total Protein: 6.5 g/dL (ref 6.0–8.5)
eGFR: 107 mL/min/{1.73_m2} (ref >59)

## 2022-08-13 LAB — LIPID PANEL
Chol/HDL Ratio: 3.3 ratio (ref 0.0–5.0)
Cholesterol, Total: 154 mg/dL (ref 100–199)
HDL: 46 mg/dL (ref >39)
LDL Chol Calc (NIH): 86 mg/dL (ref 0–99)
Triglycerides: 124 mg/dL (ref 0–149)
VLDL Cholesterol Cal: 22 mg/dL (ref 5–40)

## 2022-08-13 LAB — HEMOGLOBIN A1C
Est. average glucose Bld gHb Est-mCnc: 105 mg/dL
Hgb A1c MFr Bld: 5.3 % (ref 4.8–5.6)

## 2022-08-25 ENCOUNTER — Other Ambulatory Visit: Payer: Self-pay | Admitting: Family Medicine

## 2022-08-25 DIAGNOSIS — G8929 Other chronic pain: Secondary | ICD-10-CM

## 2022-09-20 ENCOUNTER — Other Ambulatory Visit: Payer: Self-pay | Admitting: Family

## 2022-09-20 DIAGNOSIS — G8929 Other chronic pain: Secondary | ICD-10-CM

## 2022-10-10 ENCOUNTER — Encounter: Payer: Self-pay | Admitting: Family Medicine

## 2022-10-10 DIAGNOSIS — F909 Attention-deficit hyperactivity disorder, unspecified type: Secondary | ICD-10-CM

## 2022-10-10 DIAGNOSIS — G8929 Other chronic pain: Secondary | ICD-10-CM

## 2022-10-10 MED ORDER — LISDEXAMFETAMINE DIMESYLATE 40 MG PO CAPS: 40.0000 mg | ORAL_CAPSULE | ORAL | 0 refills | Status: DC

## 2022-10-10 MED ORDER — TRAMADOL HCL 50 MG PO TABS: 50.0000 mg | ORAL_TABLET | Freq: Every day | ORAL | 0 refills | Status: DC | PRN

## 2022-11-18 ENCOUNTER — Ambulatory Visit: Payer: BC Managed Care – PPO | Admitting: Family Medicine

## 2022-11-25 ENCOUNTER — Other Ambulatory Visit: Payer: Self-pay | Admitting: Family Medicine

## 2022-11-25 DIAGNOSIS — F411 Generalized anxiety disorder: Secondary | ICD-10-CM

## 2022-12-05 ENCOUNTER — Other Ambulatory Visit: Payer: Self-pay | Admitting: Family Medicine

## 2022-12-06 ENCOUNTER — Ambulatory Visit: Payer: BC Managed Care – PPO | Admitting: Family Medicine

## 2022-12-06 ENCOUNTER — Encounter: Payer: Self-pay | Admitting: Family Medicine

## 2022-12-06 VITALS — BP 115/78 | HR 67 | Temp 98.5°F | Ht 69.0 in | Wt 218.6 lb

## 2022-12-06 DIAGNOSIS — I10 Essential (primary) hypertension: Secondary | ICD-10-CM

## 2022-12-06 DIAGNOSIS — F909 Attention-deficit hyperactivity disorder, unspecified type: Secondary | ICD-10-CM

## 2022-12-06 DIAGNOSIS — G8929 Other chronic pain: Secondary | ICD-10-CM

## 2022-12-06 DIAGNOSIS — M542 Cervicalgia: Secondary | ICD-10-CM | POA: Diagnosis not present

## 2022-12-06 DIAGNOSIS — Z87891 Personal history of nicotine dependence: Secondary | ICD-10-CM

## 2022-12-06 NOTE — Assessment & Plan Note (Signed)
Chronic issue.  Stable.  He can continue tramadol 1 to 2 tablets by mouth nightly as needed for pain.

## 2022-12-06 NOTE — Assessment & Plan Note (Signed)
Chronic issue.  Well-controlled.  I suspect his cuff at home is inaccurate.  He will continue lisinopril 40 mg daily and carvedilol 12.5 mg twice daily.  He will need a new blood pressure cuff.

## 2022-12-06 NOTE — Progress Notes (Signed)
Tommi Rumps, MD Phone: [phone]  Austin [name] is a 57 y.o. male who presents today for f/u.  ADHD Medication: vyvanse though has not been able to get this from his pharmacy in 2 months Effectiveness: yes Sleep difficulty: no Appetite suppression: no  HYPERTENSION Disease Monitoring Home BP Monitoring high though has been using an old cuff Chest pain- no    Dyspnea- no Medications Compliance-  taking lisinopril, coreg.  Edema- no BMET    Component Value Date/Time   NA 141 08/12/2022 1511   K 3.8 08/12/2022 1511   CL 103 08/12/2022 1511   CO2 25 08/12/2022 1511   GLUCOSE 95 08/12/2022 1511   GLUCOSE 88 08/04/2018 1636   BUN 11 08/12/2022 1511   CREATININE 0.73 (L) 08/12/2022 1511   CALCIUM 9.2 08/12/2022 1511   GFRNONAA 87 12/11/2020 1502   GFRAA 101 12/11/2020 1502   Former smoker: Patient smoked for 30 years 1 to 2 packs a day.  He quit sometime in the last 7 to 10 years.  Chronic neck pain: Continues on tramadol nightly for this.  Notes this is beneficial and allows him to get sleep.   Social History   Tobacco Use  Smoking Status Former   Packs/day: 1.00   Years: 30.00   Total pack years: 30.00   Types: Cigarettes   Quit date: 01/10/2012   Years since quitting: 10.9  Smokeless Tobacco Never    Current Outpatient Medications on File Prior to Visit  Medication Sig Dispense Refill   baclofen (LIORESAL) 10 MG tablet TAKE 1 TABLET BY MOUTH AT BEDTIME AS NEEDED FOR MUSCLE SPASMS. 90 tablet 1   carvedilol (COREG) 12.5 MG tablet TAKE 1 TABLET (12.'5MG'$  TOTAL) BY MOUTH TWICE A DAY WITH MEALS 180 tablet 1   lisdexamfetamine (VYVANSE) 40 MG capsule Take 1 capsule (40 mg total) by mouth every morning. 30 capsule 0   lisdexamfetamine (VYVANSE) 40 MG capsule Take 1 capsule (40 mg total) by mouth every morning. 30 capsule 0   lisdexamfetamine (VYVANSE) 40 MG capsule Take 1 capsule (40 mg total) by mouth every morning. 30 capsule 0   lisinopril (ZESTRIL) 40 MG tablet  TAKE 1 TABLET BY MOUTH EVERY DAY 90 tablet 1   meloxicam (MOBIC) 15 MG tablet Take 1 tablet (15 mg total) by mouth daily. 90 tablet 1   omeprazole (PRILOSEC) 20 MG capsule TAKE 1 CAPSULE BY MOUTH EVERY DAY 90 capsule 3   sertraline (ZOLOFT) 50 MG tablet TAKE 1 TABLET BY MOUTH EVERY DAY 90 tablet 1   traMADol (ULTRAM) 50 MG tablet Take 1-2 tablets (50-100 mg total) by mouth daily as needed for moderate pain. 60 tablet 0   No current facility-administered medications on file prior to visit.     ROS see history of present illness  Objective  Physical Exam Vitals:   12/06/22 1350  BP: 115/78  Pulse: 67  Temp: 98.5 F (36.9 C)  SpO2: 95%    BP Readings from Last 3 Encounters:  12/06/22 115/78  08/12/22 120/70  05/03/22 90/60   Wt Readings from Last 3 Encounters:  12/06/22 218 lb 9.6 oz (99.2 kg)  08/12/22 213 lb 12.8 oz (97 kg)  05/03/22 211 lb 9.6 oz (96 kg)    Physical Exam Constitutional:      General: He is not in acute distress.    Appearance: He is not diaphoretic.  Cardiovascular:     Rate and Rhythm: Normal rate and regular rhythm.     Heart sounds:  Normal heart sounds.  Pulmonary:     Effort: Pulmonary effort is normal.     Breath sounds: Normal breath sounds.  Skin:    General: Skin is warm and dry.  Neurological:     Mental Status: He is alert.      Assessment/Plan: Please see individual problem list.  Primary hypertension Assessment & Plan: Chronic issue.  Well-controlled.  I suspect his cuff at home is inaccurate.  He will continue lisinopril 40 mg daily and carvedilol 12.5 mg twice daily.  He will need a new blood pressure cuff.   Attention deficit hyperactivity disorder (ADHD), unspecified ADHD type Assessment & Plan: Chronic issue.  Patient will call around to a number of different pharmacies and see if they have Vyvanse in stock.  He will then let me know where to send his refill into.  He will continue Vyvanse 40 mg daily.  If he is not able  to find this at another pharmacy we can try something different.   Chronic neck pain Assessment & Plan: Chronic issue.  Stable.  He can continue tramadol 1 to 2 tablets by mouth nightly as needed for pain.   Former smoker Assessment & Plan: Referring for lung cancer screening.  Orders: -     Ambulatory Referral for Lung Cancer Scre    Return in about 3 months (around 03/07/2023) for adhd.   Tommi Rumps, MD [place]

## 2022-12-06 NOTE — Assessment & Plan Note (Signed)
Referring for lung cancer screening.

## 2022-12-06 NOTE — Assessment & Plan Note (Signed)
Chronic issue.  Patient will call around to a number of different pharmacies and see if they have Vyvanse in stock.  He will then let me know where to send his refill into.  He will continue Vyvanse 40 mg daily.  If he is not able to find this at another pharmacy we can try something different.

## 2022-12-10 ENCOUNTER — Encounter: Payer: Self-pay | Admitting: Family Medicine

## 2022-12-10 ENCOUNTER — Other Ambulatory Visit: Payer: Self-pay | Admitting: Family Medicine

## 2022-12-10 DIAGNOSIS — G8929 Other chronic pain: Secondary | ICD-10-CM

## 2022-12-10 MED ORDER — TRAMADOL HCL 50 MG PO TABS: 50.0000 mg | ORAL_TABLET | Freq: Every day | ORAL | 0 refills | Status: DC | PRN

## 2023-01-13 ENCOUNTER — Encounter: Payer: Self-pay | Admitting: Family Medicine

## 2023-01-25 ENCOUNTER — Other Ambulatory Visit: Payer: Self-pay | Admitting: Family Medicine

## 2023-01-25 DIAGNOSIS — G8929 Other chronic pain: Secondary | ICD-10-CM

## 2023-02-14 ENCOUNTER — Other Ambulatory Visit: Payer: Self-pay | Admitting: Family Medicine

## 2023-02-14 DIAGNOSIS — G8929 Other chronic pain: Secondary | ICD-10-CM

## 2023-02-14 NOTE — Telephone Encounter (Signed)
Requesting: Tramadol 50 mg Contract: No UDS: no Last Visit: 12/06/2022 Next Visit: 03/10/2023 Last Refill: 11/3022  Please Advise

## 2023-02-14 NOTE — Telephone Encounter (Signed)
LOV: 12/06/22  NOV: 03/10/23

## 2023-03-02 ENCOUNTER — Other Ambulatory Visit: Payer: Self-pay | Admitting: Family Medicine

## 2023-03-02 DIAGNOSIS — I10 Essential (primary) hypertension: Secondary | ICD-10-CM

## 2023-03-02 DIAGNOSIS — G8929 Other chronic pain: Secondary | ICD-10-CM

## 2023-03-10 ENCOUNTER — Encounter: Payer: Self-pay | Admitting: Family Medicine

## 2023-03-10 ENCOUNTER — Ambulatory Visit: Payer: BC Managed Care – PPO | Admitting: Family Medicine

## 2023-03-10 VITALS — BP 122/72 | HR 60 | Temp 97.8°F | Ht 69.0 in | Wt 221.0 lb

## 2023-03-10 DIAGNOSIS — G8929 Other chronic pain: Secondary | ICD-10-CM

## 2023-03-10 DIAGNOSIS — M542 Cervicalgia: Secondary | ICD-10-CM

## 2023-03-10 DIAGNOSIS — F909 Attention-deficit hyperactivity disorder, unspecified type: Secondary | ICD-10-CM

## 2023-03-10 MED ORDER — AMPHETAMINE-DEXTROAMPHET ER 20 MG PO CP24: 20.0000 mg | ORAL_CAPSULE | ORAL | 0 refills | Status: DC

## 2023-03-10 NOTE — Progress Notes (Signed)
Marikay Alar, MD Phone: [phone]  Austin [name] is a 57 y.o. male who presents today for f/u.  ADHD Medication: not on medication. Has not been able to get vyvanse for 6 months. Notes it was difficult at first though his focus has evened out some.   Chronic neck pain: Patient continues on tramadol.  It does not make him drowsy.  He notes that last until about 2 in the morning when he takes it at bedtime.  He then gets uncomfortable and has trouble sleeping.  Notes he only takes it at night before bed.   Social History   Tobacco Use  Smoking Status Former   Packs/day: 1.00   Years: 30.00   Additional pack years: 0.00   Total pack years: 30.00   Types: Cigarettes   Quit date: 01/10/2012   Years since quitting: 11.1  Smokeless Tobacco Never    Current Outpatient Medications on File Prior to Visit  Medication Sig Dispense Refill   baclofen (LIORESAL) 10 MG tablet TAKE 1 TABLET BY MOUTH AT BEDTIME AS NEEDED FOR MUSCLE SPASMS. 90 tablet 1   carvedilol (COREG) 12.5 MG tablet TAKE 1 TABLET (12.5MG  TOTAL) BY MOUTH TWICE A DAY WITH MEALS 180 tablet 1   lisinopril (ZESTRIL) 40 MG tablet TAKE 1 TABLET BY MOUTH EVERY DAY 90 tablet 1   meloxicam (MOBIC) 15 MG tablet TAKE 1 TABLET (15 MG TOTAL) BY MOUTH DAILY. 90 tablet 1   omeprazole (PRILOSEC) 20 MG capsule TAKE 1 CAPSULE BY MOUTH EVERY DAY 90 capsule 3   sertraline (ZOLOFT) 50 MG tablet TAKE 1 TABLET BY MOUTH EVERY DAY 90 tablet 1   traMADol (ULTRAM) 50 MG tablet TAKE 1-2 TABLETS (50-100 MG TOTAL) BY MOUTH DAILY AS NEEDED FOR MODERATE PAIN. 60 tablet 0   No current facility-administered medications on file prior to visit.     ROS see history of present illness  Objective  Physical Exam Vitals:   03/10/23 1359  BP: 122/72  Pulse: 60  Temp: 97.8 F (36.6 C)  SpO2: 97%    BP Readings from Last 3 Encounters:  03/10/23 122/72  12/06/22 115/78  08/12/22 120/70   Wt Readings from Last 3 Encounters:  03/10/23 221 lb  (100.2 kg)  12/06/22 218 lb 9.6 oz (99.2 kg)  08/12/22 213 lb 12.8 oz (97 kg)    Physical Exam Constitutional:      General: He is not in acute distress.    Appearance: He is not diaphoretic.  Cardiovascular:     Rate and Rhythm: Normal rate and regular rhythm.     Heart sounds: Normal heart sounds.  Pulmonary:     Effort: Pulmonary effort is normal.     Breath sounds: Normal breath sounds.  Neurological:     Mental Status: He is alert.      Assessment/Plan: Please see individual problem list.  Attention deficit hyperactivity disorder (ADHD), unspecified ADHD type Assessment & Plan: Chronic issue. Suboptimally controlled. We will start adderall XR 20 mg daily. He will monitor for palpitations, appetite suppression, and sleep issues. He will let me know if this is too expensive or if he is not able to get this.   Orders: -     Amphetamine-Dextroamphet ER; Take 1 capsule (20 mg total) by mouth every morning.  Dispense: 30 capsule; Refill: 0  Chronic neck pain Assessment & Plan: Chronic issue. Discussed the option of switching to extended release tramadol, though he defers this. We will continue tramadol 1-2 tablets qhs prn  for pain.      Return in about 1 month (around 04/09/2023) for adhd.   Marikay Alar, MD [facility]

## 2023-03-10 NOTE — Assessment & Plan Note (Signed)
Chronic issue. Suboptimally controlled. We will start adderall XR 20 mg daily. He will monitor for palpitations, appetite suppression, and sleep issues. He will let me know if this is too expensive or if he is not able to get this.

## 2023-03-10 NOTE — Assessment & Plan Note (Signed)
Chronic issue. Discussed the option of switching to extended release tramadol, though he defers this. We will continue tramadol 1-2 tablets qhs prn for pain.

## 2023-03-18 ENCOUNTER — Encounter: Payer: Self-pay | Admitting: Family Medicine

## 2023-03-19 ENCOUNTER — Telehealth: Payer: Self-pay

## 2023-03-19 ENCOUNTER — Other Ambulatory Visit (HOSPITAL_COMMUNITY): Payer: Self-pay

## 2023-03-19 NOTE — Telephone Encounter (Signed)
noted 

## 2023-03-19 NOTE — Telephone Encounter (Signed)
PA has been started and documented in separate encounter.

## 2023-03-19 NOTE — Telephone Encounter (Signed)
Patient Advocate Encounter   Received notification from [employer] that prior authorization for Amphetamine-dextroamphetamine ER 20mg  is required.   PA submitted on 03/19/2023 [facility] Status is pending     WAITING ON CLINICAL QUESTIONS TO POPULATE

## 2023-03-21 DIAGNOSIS — F909 Attention-deficit hyperactivity disorder, unspecified type: Secondary | ICD-10-CM

## 2023-03-21 NOTE — Telephone Encounter (Signed)
Pharmacy Patient Advocate Encounter  Prior Authorization for Amphetamine-Dextroamphetamine ER has been approved by [employer] (ins).    PA # [id number] Effective dates: 03/19/2023 through 03/18/2026

## 2023-03-27 MED ORDER — AMPHETAMINE-DEXTROAMPHET ER 20 MG PO CP24: 20.0000 mg | ORAL_CAPSULE | ORAL | 0 refills | Status: DC

## 2023-04-09 ENCOUNTER — Encounter: Payer: Self-pay | Admitting: Family Medicine

## 2023-04-09 ENCOUNTER — Telehealth: Payer: BC Managed Care – PPO | Admitting: Family Medicine

## 2023-04-09 VITALS — Ht 69.0 in | Wt 221.0 lb

## 2023-04-09 DIAGNOSIS — F909 Attention-deficit hyperactivity disorder, unspecified type: Secondary | ICD-10-CM | POA: Diagnosis not present

## 2023-04-09 DIAGNOSIS — I10 Essential (primary) hypertension: Secondary | ICD-10-CM

## 2023-04-09 MED ORDER — AMPHETAMINE-DEXTROAMPHET ER 20 MG PO CP24: 20.0000 mg | ORAL_CAPSULE | ORAL | 0 refills | Status: DC

## 2023-04-09 NOTE — Assessment & Plan Note (Signed)
Chronic issue.  Adequately controlled currently.  Patient will continue carvedilol 12.5 mg twice daily and lisinopril 40 mg daily.

## 2023-04-09 NOTE — Assessment & Plan Note (Signed)
Chronic issue.  Adequately controlled.  Continue Adderall XR 20 mg daily.  Controlled substance database reviewed.  Refill sent to pharmacy.  Follow-up in 3 months.
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
## 2023-04-18 ENCOUNTER — Other Ambulatory Visit: Payer: Self-pay | Admitting: Family Medicine

## 2023-04-18 DIAGNOSIS — G8929 Other chronic pain: Secondary | ICD-10-CM

## 2023-06-04 ENCOUNTER — Other Ambulatory Visit: Payer: Self-pay | Admitting: Family Medicine

## 2023-06-04 DIAGNOSIS — F411 Generalized anxiety disorder: Secondary | ICD-10-CM

## 2023-06-09 ENCOUNTER — Other Ambulatory Visit: Payer: Self-pay | Admitting: Family Medicine

## 2023-06-09 DIAGNOSIS — I10 Essential (primary) hypertension: Secondary | ICD-10-CM

## 2023-06-09 DIAGNOSIS — G8929 Other chronic pain: Secondary | ICD-10-CM

## 2023-06-11 ENCOUNTER — Encounter (INDEPENDENT_AMBULATORY_CARE_PROVIDER_SITE_OTHER): Payer: Self-pay

## 2023-07-02 ENCOUNTER — Other Ambulatory Visit: Payer: Self-pay | Admitting: Family Medicine

## 2023-07-02 DIAGNOSIS — G8929 Other chronic pain: Secondary | ICD-10-CM

## 2023-07-03 NOTE — Telephone Encounter (Signed)
Refilled: 04/21/2023 Last OV: 04/09/2023 Next OV: 07/11/2023

## 2023-07-11 ENCOUNTER — Encounter: Payer: Self-pay | Admitting: Family Medicine

## 2023-07-11 ENCOUNTER — Ambulatory Visit: Payer: BC Managed Care – PPO | Admitting: Family Medicine

## 2023-07-11 ENCOUNTER — Ambulatory Visit (INDEPENDENT_AMBULATORY_CARE_PROVIDER_SITE_OTHER): Payer: BC Managed Care – PPO

## 2023-07-11 VITALS — BP 116/74 | HR 79 | Temp 98.0°F | Ht 69.0 in | Wt 211.2 lb

## 2023-07-11 DIAGNOSIS — M542 Cervicalgia: Secondary | ICD-10-CM | POA: Diagnosis not present

## 2023-07-11 DIAGNOSIS — F909 Attention-deficit hyperactivity disorder, unspecified type: Secondary | ICD-10-CM | POA: Diagnosis not present

## 2023-07-11 DIAGNOSIS — M25562 Pain in left knee: Secondary | ICD-10-CM | POA: Diagnosis not present

## 2023-07-11 DIAGNOSIS — G8929 Other chronic pain: Secondary | ICD-10-CM | POA: Insufficient documentation

## 2023-07-11 MED ORDER — AMPHETAMINE-DEXTROAMPHET ER 20 MG PO CP24: 20.0000 mg | ORAL_CAPSULE | ORAL | 0 refills | Status: DC

## 2023-07-11 NOTE — Progress Notes (Signed)
Austin Alar, MD Phone: [phone]  [name] is a 57 y.o. male who presents today for f/u.  ADHD Medication: adderall XR 20 mg daily Effectiveness: yes, though he felt like vyvanse worked better but was unable to get this Palpitations: no Sleep difficulty: no Appetite suppression: no  Chronic neck pain: This is stable.  Continues to take tramadol for pain at night.  No drowsiness the next day.  Knee pain: Patient notes 4 to 6 months ago he started to have pain would be in his left knee thigh and up to his hip.  When it occurs it is 10 out of 10.  Notes it comes out of nowhere.  It has been occurring almost daily recently.  It can wake him up.  At this point most of the discomfort is in his knee.  Last for up to 35 seconds at a time.   Social History   Tobacco Use  Smoking Status Former   Current packs/day: 0.00   Average packs/day: 1 pack/day for 30.0 years (30.0 ttl pk-yrs)   Types: Cigarettes   Start date: 01/09/1982   Quit date: 01/10/2012   Years since quitting: 11.5  Smokeless Tobacco Never    Current Outpatient Medications on File Prior to Visit  Medication Sig Dispense Refill   baclofen (LIORESAL) 10 MG tablet TAKE 1 TABLET BY MOUTH AT BEDTIME AS NEEDED FOR MUSCLE SPASMS. 90 tablet 1   carvedilol (COREG) 12.5 MG tablet TAKE 1 TABLET (12.5MG  TOTAL) BY MOUTH TWICE A DAY WITH MEALS 180 tablet 1   lisinopril (ZESTRIL) 40 MG tablet TAKE 1 TABLET BY MOUTH EVERY DAY 90 tablet 1   meloxicam (MOBIC) 15 MG tablet TAKE 1 TABLET (15 MG TOTAL) BY MOUTH DAILY. 90 tablet 1   omeprazole (PRILOSEC) 20 MG capsule TAKE 1 CAPSULE BY MOUTH EVERY DAY 90 capsule 3   sertraline (ZOLOFT) 50 MG tablet TAKE 1 TABLET BY MOUTH EVERY DAY 90 tablet 1   traMADol (ULTRAM) 50 MG tablet TAKE 1-2 TABLETS (50-100 MG TOTAL) BY MOUTH DAILY AS NEEDED FOR MODERATE PAIN. 60 tablet 0   No current facility-administered medications on file prior to visit.     ROS see history of present  illness  Objective  Physical Exam Vitals:   07/11/23 1425  BP: 116/74  Pulse: 79  Temp: 98 F (36.7 C)  SpO2: 96%    BP Readings from Last 3 Encounters:  07/11/23 116/74  03/10/23 122/72  12/06/22 115/78   Wt Readings from Last 3 Encounters:  07/11/23 211 lb 3.2 oz (95.8 kg)  04/09/23 221 lb (100.2 kg)  03/10/23 221 lb (100.2 kg)    Physical Exam Constitutional:      General: He is not in acute distress.    Appearance: He is not diaphoretic.  Cardiovascular:     Rate and Rhythm: Normal rate and regular rhythm.     Heart sounds: Normal heart sounds.  Pulmonary:     Effort: Pulmonary effort is normal.     Breath sounds: Normal breath sounds.  Musculoskeletal:     Comments: Left knee with no tenderness, good range of motion of left knee, good range of motion of left hip with no discomfort  Skin:    General: Skin is warm and dry.  Neurological:     Mental Status: He is alert.      Assessment/Plan: Please see individual problem list.  Attention deficit hyperactivity disorder (ADHD), unspecified ADHD type Assessment & Plan: Chronic issue.  Stable.  Patient will continue Adderall XR 20 mg daily.  Refill sent to pharmacy.  Controlled substance database reviewed.  Follow-up in 3 months.  Orders: -     Amphetamine-Dextroamphet ER; Take 1 capsule (20 mg total) by mouth every morning.  Dispense: 30 capsule; Refill: 0 -     Amphetamine-Dextroamphet ER; Take 1 capsule (20 mg total) by mouth every morning.  Dispense: 30 capsule; Refill: 0 -     Amphetamine-Dextroamphet ER; Take 1 capsule (20 mg total) by mouth every morning.  Dispense: 30 capsule; Refill: 0  Chronic pain of left knee Assessment & Plan: Ongoing issue with left knee pain.  Does have some thigh this though mostly in his left knee.  Will get an x-ray today and determine the neck step in management.  Orders: -     DG Knee Complete 4 Views Left; Future  Chronic neck pain Assessment & Plan: Chronic issue  stable.  Patient will continue tramadol 1 to 2 tablets nightly as needed for pain.     Return in about 3 months (around 10/11/2023).   Austin Alar, MD [facility] Primary Care [facility]

## 2023-07-11 NOTE — Assessment & Plan Note (Signed)
Chronic issue stable.  Patient will continue tramadol 1 to 2 tablets nightly as needed for pain.

## 2023-07-11 NOTE — Assessment & Plan Note (Signed)
Chronic issue.  Stable.  Patient will continue Adderall XR 20 mg daily.  Refill sent to pharmacy.  Controlled substance database reviewed.  Follow-up in 3 months.

## 2023-07-11 NOTE — Assessment & Plan Note (Signed)
Ongoing issue with left knee pain.  Does have some thigh this though mostly in his left knee.  Will get an x-ray today and determine the neck step in management.

## 2023-07-20 ENCOUNTER — Other Ambulatory Visit: Payer: Self-pay | Admitting: Family Medicine

## 2023-07-20 DIAGNOSIS — K219 Gastro-esophageal reflux disease without esophagitis: Secondary | ICD-10-CM

## 2023-07-20 DIAGNOSIS — G8929 Other chronic pain: Secondary | ICD-10-CM

## 2023-09-01 ENCOUNTER — Other Ambulatory Visit: Payer: Self-pay | Admitting: Family Medicine

## 2023-09-01 DIAGNOSIS — G8929 Other chronic pain: Secondary | ICD-10-CM

## 2023-10-17 ENCOUNTER — Ambulatory Visit: Payer: BC Managed Care – PPO | Admitting: Family Medicine

## 2023-10-17 VITALS — BP 118/76 | HR 75 | Temp 97.6°F | Ht 69.0 in | Wt 210.0 lb

## 2023-10-17 DIAGNOSIS — G8929 Other chronic pain: Secondary | ICD-10-CM

## 2023-10-17 DIAGNOSIS — I10 Essential (primary) hypertension: Secondary | ICD-10-CM

## 2023-10-17 DIAGNOSIS — F909 Attention-deficit hyperactivity disorder, unspecified type: Secondary | ICD-10-CM | POA: Diagnosis not present

## 2023-10-17 DIAGNOSIS — M542 Cervicalgia: Secondary | ICD-10-CM | POA: Diagnosis not present

## 2023-10-17 MED ORDER — AMPHETAMINE-DEXTROAMPHET ER 20 MG PO CP24
20.0000 mg | ORAL_CAPSULE | ORAL | 0 refills | Status: DC
Start: 2023-11-17 — End: 2024-01-22

## 2023-10-17 MED ORDER — AMPHETAMINE-DEXTROAMPHET ER 20 MG PO CP24
20.0000 mg | ORAL_CAPSULE | ORAL | 0 refills | Status: DC
Start: 2023-10-17 — End: 2024-01-22

## 2023-10-17 MED ORDER — TRAMADOL HCL 50 MG PO TABS: 50.0000 mg | ORAL_TABLET | Freq: Every day | ORAL | 0 refills | Status: DC | PRN

## 2023-10-17 MED ORDER — AMPHETAMINE-DEXTROAMPHET ER 20 MG PO CP24
20.0000 mg | ORAL_CAPSULE | ORAL | 0 refills | Status: DC
Start: 2023-12-18 — End: 2024-01-22

## 2023-10-17 NOTE — Assessment & Plan Note (Signed)
Chronic issue.  Adequately controlled.  Patient will continue carvedilol 12.5 mg twice daily and lisinopril 40 mg daily.

## 2023-10-17 NOTE — Assessment & Plan Note (Signed)
Chronic issue.  Stable on tramadol 1 to 2 tablets nightly as needed for pain.

## 2023-10-17 NOTE — Assessment & Plan Note (Signed)
Chronic issue.  Stable.  Patient will continue Adderall XR 20 mg daily.  Refill sent to pharmacy.  Controlled substance database reviewed.

## 2023-10-17 NOTE — Progress Notes (Signed)
Marikay Alar, MD Phone: [phone]  Austin [name] is a 57 y.o. male who presents today for f/u.  HYPERTENSION Disease Monitoring Home BP Monitoring not checking Chest pain- no    Dyspnea- no Medications Compliance-  taking lisinopril, carvedilol.   Edema- no BMET    Component Value Date/Time   NA 141 08/12/2022 1511   K 3.8 08/12/2022 1511   CL 103 08/12/2022 1511   CO2 25 08/12/2022 1511   GLUCOSE 95 08/12/2022 1511   GLUCOSE 88 08/04/2018 1636   BUN 11 08/12/2022 1511   CREATININE 0.73 (L) 08/12/2022 1511   CALCIUM 9.2 08/12/2022 1511   GFRNONAA 87 12/11/2020 1502   GFRAA 101 12/11/2020 1502   ADHD Medication: taking adderral XR Effectiveness: yes Palpitations: no Sleep difficulty: no Appetite suppression: no   Social History   Tobacco Use  Smoking Status Former   Current packs/day: 0.00   Average packs/day: 1 pack/day for 30.0 years (30.0 ttl pk-yrs)   Types: Cigarettes   Start date: 01/09/1982   Quit date: 01/10/2012   Years since quitting: 11.7  Smokeless Tobacco Never    Current Outpatient Medications on File Prior to Visit  Medication Sig Dispense Refill   baclofen (LIORESAL) 10 MG tablet TAKE 1 TABLET BY MOUTH AT BEDTIME AS NEEDED FOR MUSCLE SPASMS. 90 tablet 1   carvedilol (COREG) 12.5 MG tablet TAKE 1 TABLET (12.5MG  TOTAL) BY MOUTH TWICE A DAY WITH MEALS 180 tablet 1   lisinopril (ZESTRIL) 40 MG tablet TAKE 1 TABLET BY MOUTH EVERY DAY 90 tablet 1   meloxicam (MOBIC) 15 MG tablet TAKE 1 TABLET (15 MG TOTAL) BY MOUTH DAILY. 90 tablet 1   omeprazole (PRILOSEC) 20 MG capsule TAKE 1 CAPSULE BY MOUTH EVERY DAY 90 capsule 3   sertraline (ZOLOFT) 50 MG tablet TAKE 1 TABLET BY MOUTH EVERY DAY 90 tablet 1   No current facility-administered medications on file prior to visit.     ROS see history of present illness  Objective  Physical Exam Vitals:   10/17/23 1346  BP: 118/76  Pulse: 75  Temp: 97.6 F (36.4 C)  SpO2: 97%    BP Readings from  Last 3 Encounters:  10/17/23 118/76  07/11/23 116/74  03/10/23 122/72   Wt Readings from Last 3 Encounters:  10/17/23 210 lb (95.3 kg)  07/11/23 211 lb 3.2 oz (95.8 kg)  04/09/23 221 lb (100.2 kg)    Physical Exam Constitutional:      General: He is not in acute distress.    Appearance: He is not diaphoretic.  Cardiovascular:     Rate and Rhythm: Normal rate and regular rhythm.     Heart sounds: Normal heart sounds.  Pulmonary:     Effort: Pulmonary effort is normal.     Breath sounds: Normal breath sounds.  Musculoskeletal:     Right lower leg: No edema.     Left lower leg: No edema.  Skin:    General: Skin is warm and dry.  Neurological:     Mental Status: He is alert.      Assessment/Plan: Please see individual problem list.  Primary hypertension Assessment & Plan: Chronic issue.  Adequately controlled.  Patient will continue carvedilol 12.5 mg twice daily and lisinopril 40 mg daily.   Attention deficit hyperactivity disorder (ADHD), unspecified ADHD type Assessment & Plan: Chronic issue.  Stable.  Patient will continue Adderall XR 20 mg daily.  Refill sent to pharmacy.  Controlled substance database reviewed.  Orders: -  Amphetamine-Dextroamphet ER; Take 1 capsule (20 mg total) by mouth every morning.  Dispense: 30 capsule; Refill: 0 -     Amphetamine-Dextroamphet ER; Take 1 capsule (20 mg total) by mouth every morning.  Dispense: 30 capsule; Refill: 0 -     Amphetamine-Dextroamphet ER; Take 1 capsule (20 mg total) by mouth every morning.  Dispense: 30 capsule; Refill: 0  Chronic neck pain Assessment & Plan: Chronic issue.  Stable on tramadol 1 to 2 tablets nightly as needed for pain.  Orders: -     traMADol HCl; Take 1-2 tablets (50-100 mg total) by mouth daily as needed for moderate pain (pain score 4-6).  Dispense: 60 tablet; Refill: 0    Return in about 3 months (around 01/15/2024) for transfer of care.   Marikay Alar, MD [facility]

## 2023-11-30 ENCOUNTER — Other Ambulatory Visit: Payer: Self-pay | Admitting: Family Medicine

## 2023-11-30 DIAGNOSIS — F411 Generalized anxiety disorder: Secondary | ICD-10-CM

## 2023-12-02 ENCOUNTER — Telehealth: Payer: Self-pay | Admitting: *Deleted

## 2023-12-02 ENCOUNTER — Other Ambulatory Visit: Payer: Self-pay | Admitting: *Deleted

## 2023-12-02 DIAGNOSIS — Z122 Encounter for screening for malignant neoplasm of respiratory organs: Secondary | ICD-10-CM

## 2023-12-02 DIAGNOSIS — Z87891 Personal history of nicotine dependence: Secondary | ICD-10-CM

## 2023-12-02 NOTE — Telephone Encounter (Signed)
Lung Cancer Screening Narrative/Criteria Questionnaire (Cigarette Smokers Only- No Cigars/Pipes/vapes)   Austin [name]   SDMV:01/08/24 2:30   [name]  06/01/1966              LDCT: 01/08/24 4:00 DRI Tavares    58 y.o.   Phone: [phone]  Lung Screening Narrative (confirm age 44-77 yrs Medicare / 50-80 yrs Private pay insurance)   Insurance information:[employer] UJ:[id number]   Referring Provider:sonnenberg   This screening involves an initial phone call with a team member from our program. It is called a shared decision making visit. The initial meeting is required by  insurance and Medicare to make sure you understand the program. This appointment takes about 15-20 minutes to complete. You will complete the screening scan at your scheduled date/time.  This scan takes about 5-10 minutes to complete. You can eat and drink normally before and after the scan.  Criteria questions for Lung Cancer Screening:   Are you a current or former smoker? Former Age began smoking: 12   If you are a former smoker, what year did you quit smoking? 2018(within 15 yrs)   To calculate your smoking history, I need an accurate estimate of how many packs of cigarettes you smoked per day and for how many years. (Not just the number of PPD you are now smoking)   Years smoking 38 x Packs per day 2-3 = Pack years 95   (at least 20 pack yrs)   (Make sure they understand that we need to know how much they have smoked in the past, not just the number of PPD they are smoking now)  Do you have a personal history of cancer?  No    Do you have a family history of cancer? No  Are you coughing up blood?  No  Have you had unexplained weight loss of 15 lbs or more in the last 6 months? No  It looks like you meet all criteria.  When would be a good time for [place] to schedule you for this screening?   Additional information: N/A

## 2023-12-25 NOTE — Telephone Encounter (Signed)
Please update patient's insurance, and let Denise/Sarah know as estimate may be for [facility] program. Thanks!

## 2024-01-08 ENCOUNTER — Ambulatory Visit
Admission: RE | Admit: 2024-01-08 | Discharge: 2024-01-08 | Disposition: A | Discharge status: Home / Self Care | Payer: Self-pay | Source: Ambulatory Visit | Attending: Acute Care | Admitting: Acute Care

## 2024-01-08 ENCOUNTER — Encounter: Payer: Self-pay | Admitting: Adult Health

## 2024-01-08 ENCOUNTER — Ambulatory Visit (INDEPENDENT_AMBULATORY_CARE_PROVIDER_SITE_OTHER): Payer: 59 | Admitting: Adult Health

## 2024-01-08 DIAGNOSIS — Z122 Encounter for screening for malignant neoplasm of respiratory organs: Secondary | ICD-10-CM

## 2024-01-08 DIAGNOSIS — Z87891 Personal history of nicotine dependence: Secondary | ICD-10-CM

## 2024-01-08 NOTE — Progress Notes (Signed)
  Virtual Visit via Telephone Note  I connected with Austin [name] , 01/08/24 2:29 PM by a telemedicine application and verified that I am speaking with the correct person using two identifiers.  Location: Patient: home Provider: home   I discussed the limitations of evaluation and management by telemedicine and the availability of in person appointments. The patient expressed understanding and agreed to proceed.   Shared Decision Making Visit Lung Cancer Screening Program [phone])   Eligibility: 58 y.o. Pack Years Smoking History Calculation = 95 pack years  (# packs/per year x # years smoked) Recent History of coughing up blood  no Unexplained weight loss? no ( >Than 15 pounds within the last 6 months ) Prior History Lung / other cancer no (Diagnosis within the last 5 years already requiring surveillance chest CT Scans). Smoking Status Former Smoker Former Smokers: Years since quit: 7 years  Quit Date: 2018  Visit Components: Discussion included one or more decision making aids. YES Discussion included risk/benefits of screening. YES Discussion included potential follow up diagnostic testing for abnormal scans. YES Discussion included meaning and risk of over diagnosis. YES Discussion included meaning and risk of False Positives. YES Discussion included meaning of total radiation exposure. YES  Counseling Included: Importance of adherence to annual lung cancer LDCT screening. YES Impact of comorbidities on ability to participate in the program. YES Ability and willingness to under diagnostic treatment. YES  Smoking Cessation Counseling: Former Smokers:  Discussed the importance of maintaining cigarette abstinence. yes Diagnosis Code: Personal History of Nicotine Dependence. U04.540 Information about tobacco cessation classes and interventions provided to patient. Yes Patient provided with "ticket" for LDCT Scan. yes Written Order for Lung Cancer Screening with LDCT  placed in Epic. Yes (CT Chest Lung Cancer Screening Low Dose W/O CM) [id number]  Z12.2-Screening of respiratory organs Z87.891-Personal history of nicotine dependence   [name] 01/08/24

## 2024-01-08 NOTE — Patient Instructions (Signed)

## 2024-01-16 ENCOUNTER — Telehealth: Payer: Self-pay | Admitting: Family

## 2024-01-16 DIAGNOSIS — G8929 Other chronic pain: Secondary | ICD-10-CM

## 2024-01-16 MED ORDER — TRAMADOL HCL 50 MG PO TABS: 50.0000 mg | ORAL_TABLET | Freq: Every day | ORAL | 0 refills | Status: DC | PRN

## 2024-01-16 MED ORDER — MELOXICAM 15 MG PO TABS: 15.0000 mg | ORAL_TABLET | Freq: Every day | ORAL | 1 refills | Status: DC | PRN

## 2024-01-16 NOTE — Telephone Encounter (Signed)
 Refilled

## 2024-01-16 NOTE — Telephone Encounter (Signed)
 Spoke to pt he is out of Tramadol and Meloxicam he will be out of by Tues but he really needs it ned [facility] [id number]

## 2024-01-16 NOTE — Telephone Encounter (Signed)
 Call pt  Wife informed me he is out of tramadol 50-100mg  every day prn . Last filled 10/17/23   He has TOC visit with me next week  Please confirm so I can refill

## 2024-01-22 ENCOUNTER — Encounter: Payer: Self-pay | Admitting: Family

## 2024-01-22 ENCOUNTER — Ambulatory Visit: Payer: BC Managed Care – PPO | Admitting: Family

## 2024-01-22 VITALS — BP 120/70 | HR 74 | Temp 98.2°F | Ht 68.0 in | Wt 214.4 lb

## 2024-01-22 DIAGNOSIS — Z87898 Personal history of other specified conditions: Secondary | ICD-10-CM

## 2024-01-22 DIAGNOSIS — F909 Attention-deficit hyperactivity disorder, unspecified type: Secondary | ICD-10-CM | POA: Diagnosis not present

## 2024-01-22 DIAGNOSIS — M542 Cervicalgia: Secondary | ICD-10-CM

## 2024-01-22 DIAGNOSIS — Z125 Encounter for screening for malignant neoplasm of prostate: Secondary | ICD-10-CM

## 2024-01-22 DIAGNOSIS — Z136 Encounter for screening for cardiovascular disorders: Secondary | ICD-10-CM

## 2024-01-22 DIAGNOSIS — I10 Essential (primary) hypertension: Secondary | ICD-10-CM

## 2024-01-22 DIAGNOSIS — G8929 Other chronic pain: Secondary | ICD-10-CM

## 2024-01-22 DIAGNOSIS — K219 Gastro-esophageal reflux disease without esophagitis: Secondary | ICD-10-CM

## 2024-01-22 DIAGNOSIS — Z13220 Encounter for screening for lipoid disorders: Secondary | ICD-10-CM

## 2024-01-22 DIAGNOSIS — R0982 Postnasal drip: Secondary | ICD-10-CM

## 2024-01-22 MED ORDER — AMPHETAMINE-DEXTROAMPHET ER 20 MG PO CP24: 20.0000 mg | ORAL_CAPSULE | ORAL | 0 refills | Status: DC

## 2024-01-22 NOTE — Assessment & Plan Note (Addendum)
 Formally diagnosed 05/31/2015 Bryson Dames Ph.D [facility], ADHD . Stable on medication.  Refills provided for Adderall 20 mg QD
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
## 2024-01-29 ENCOUNTER — Other Ambulatory Visit: Payer: Self-pay

## 2024-01-29 DIAGNOSIS — R0982 Postnasal drip: Secondary | ICD-10-CM | POA: Insufficient documentation

## 2024-01-29 DIAGNOSIS — Z122 Encounter for screening for malignant neoplasm of respiratory organs: Secondary | ICD-10-CM

## 2024-01-29 DIAGNOSIS — Z87891 Personal history of nicotine dependence: Secondary | ICD-10-CM

## 2024-01-29 NOTE — Assessment & Plan Note (Signed)
 Chronic, stable.  Continue tramadol 50 mg nightly as needed

## 2024-01-29 NOTE — Assessment & Plan Note (Signed)
 Reassuring HEENT exam.  Advised likely viral etiology.  Advised to start over-the-counter antihistamine and let me know if symptoms persist.

## 2024-01-30 ENCOUNTER — Telehealth: Admitting: Physician Assistant

## 2024-01-30 DIAGNOSIS — J40 Bronchitis, not specified as acute or chronic: Secondary | ICD-10-CM

## 2024-01-30 MED ORDER — BENZONATATE 200 MG PO CAPS: 200.0000 mg | ORAL_CAPSULE | Freq: Two times a day (BID) | ORAL | 0 refills | Status: DC | PRN

## 2024-01-30 MED ORDER — AZITHROMYCIN 250 MG PO TABS: ORAL_TABLET | ORAL | 0 refills | Status: AC

## 2024-01-30 NOTE — Progress Notes (Signed)

## 2024-02-12 ENCOUNTER — Other Ambulatory Visit (INDEPENDENT_AMBULATORY_CARE_PROVIDER_SITE_OTHER)

## 2024-02-12 DIAGNOSIS — G8929 Other chronic pain: Secondary | ICD-10-CM

## 2024-02-12 DIAGNOSIS — Z13220 Encounter for screening for lipoid disorders: Secondary | ICD-10-CM

## 2024-02-12 DIAGNOSIS — Z87898 Personal history of other specified conditions: Secondary | ICD-10-CM

## 2024-02-12 DIAGNOSIS — I10 Essential (primary) hypertension: Secondary | ICD-10-CM

## 2024-02-12 DIAGNOSIS — Z125 Encounter for screening for malignant neoplasm of prostate: Secondary | ICD-10-CM

## 2024-02-12 DIAGNOSIS — K219 Gastro-esophageal reflux disease without esophagitis: Secondary | ICD-10-CM

## 2024-02-12 NOTE — Addendum Note (Signed)
 Addended by: Jarvis Morgan D on: 02/12/2024 08:11 AM   Modules accepted: Orders

## 2024-02-12 NOTE — Addendum Note (Signed)
 Addended by: Jarvis Morgan D on: 02/12/2024 03:22 PM   Modules accepted: Orders

## 2024-02-13 ENCOUNTER — Encounter: Payer: Self-pay | Admitting: Family

## 2024-02-13 LAB — CBC WITH DIFFERENTIAL/PLATELET
Basophils Absolute: 0.1 10*3/uL (ref 0.0–0.2)
Basos: 1 %
EOS (ABSOLUTE): 0.1 10*3/uL (ref 0.0–0.4)
Eos: 3 %
Hematocrit: 41.4 % (ref 37.5–51.0)
Hemoglobin: 13.7 g/dL (ref 13.0–17.7)
Immature Grans (Abs): 0 10*3/uL (ref 0.0–0.1)
Immature Granulocytes: 0 %
Lymphocytes Absolute: 1.3 10*3/uL (ref 0.7–3.1)
Lymphs: 35 %
MCH: 29.9 pg (ref 26.6–33.0)
MCHC: 33.1 g/dL (ref 31.5–35.7)
MCV: 90 fL (ref 79–97)
Monocytes Absolute: 0.3 10*3/uL (ref 0.1–0.9)
Monocytes: 8 %
Neutrophils Absolute: 2 10*3/uL (ref 1.4–7.0)
Neutrophils: 53 %
Platelets: 199 10*3/uL (ref 150–450)
RBC: 4.58 x10E6/uL (ref 4.14–5.80)
RDW: 11.8 % (ref 11.6–15.4)
WBC: 3.9 10*3/uL (ref 3.4–10.8)

## 2024-02-13 LAB — COMPREHENSIVE METABOLIC PANEL WITH GFR
ALT: 17 IU/L (ref 0–44)
AST: 22 IU/L (ref 0–40)
Albumin: 4.3 g/dL (ref 3.8–4.9)
Alkaline Phosphatase: 91 IU/L (ref 44–121)
BUN/Creatinine Ratio: 15 (ref 9–20)
BUN: 14 mg/dL (ref 6–24)
Bilirubin Total: 0.5 mg/dL (ref 0.0–1.2)
CO2: 25 mmol/L (ref 20–29)
Calcium: 9.2 mg/dL (ref 8.7–10.2)
Chloride: 103 mmol/L (ref 96–106)
Creatinine, Ser: 0.95 mg/dL (ref 0.76–1.27)
Globulin, Total: 2.1 g/dL (ref 1.5–4.5)
Glucose: 79 mg/dL (ref 70–99)
Potassium: 4.7 mmol/L (ref 3.5–5.2)
Sodium: 139 mmol/L (ref 134–144)
Total Protein: 6.4 g/dL (ref 6.0–8.5)
eGFR: 93 mL/min/{1.73_m2} (ref >59)

## 2024-02-13 LAB — HEMOGLOBIN A1C
Est. average glucose Bld gHb Est-mCnc: 120 mg/dL
Hgb A1c MFr Bld: 5.8 % — ABNORMAL HIGH (ref 4.8–5.6)

## 2024-02-13 LAB — LIPID PANEL
Chol/HDL Ratio: 3 ratio (ref 0.0–5.0)
Cholesterol, Total: 153 mg/dL (ref 100–199)
HDL: 51 mg/dL (ref >39)
LDL Chol Calc (NIH): 88 mg/dL (ref 0–99)
Triglycerides: 73 mg/dL (ref 0–149)
VLDL Cholesterol Cal: 14 mg/dL (ref 5–40)

## 2024-02-13 LAB — PSA: Prostate Specific Ag, Serum: 0.2 ng/mL (ref 0.0–4.0)

## 2024-02-13 LAB — VITAMIN D 25 HYDROXY (VIT D DEFICIENCY, FRACTURES): Vit D, 25-Hydroxy: 26.5 ng/mL — ABNORMAL LOW (ref 30.0–100.0)

## 2024-02-13 LAB — TSH: TSH: 1.88 u[IU]/mL (ref 0.450–4.500)

## 2024-02-13 NOTE — Progress Notes (Signed)
 Spoke with patient to follow up with viewing his results via MyChart and to see if he had any follow up questions. Patient declines having any questions and verbalized understanding.

## 2024-02-27 ENCOUNTER — Other Ambulatory Visit: Payer: Self-pay | Admitting: Family

## 2024-02-27 DIAGNOSIS — I10 Essential (primary) hypertension: Secondary | ICD-10-CM

## 2024-03-02 ENCOUNTER — Other Ambulatory Visit: Payer: Self-pay | Admitting: Family

## 2024-03-02 DIAGNOSIS — G8929 Other chronic pain: Secondary | ICD-10-CM

## 2024-03-02 MED ORDER — BACLOFEN 10 MG PO TABS: ORAL_TABLET | ORAL | 1 refills | Status: DC

## 2024-03-18 ENCOUNTER — Other Ambulatory Visit: Payer: Self-pay | Admitting: Family

## 2024-03-18 DIAGNOSIS — G8929 Other chronic pain: Secondary | ICD-10-CM

## 2024-04-29 ENCOUNTER — Ambulatory Visit (INDEPENDENT_AMBULATORY_CARE_PROVIDER_SITE_OTHER): Admitting: Family

## 2024-04-29 ENCOUNTER — Encounter: Payer: Self-pay | Admitting: Family

## 2024-04-29 ENCOUNTER — Telehealth: Payer: Self-pay | Admitting: Pharmacist

## 2024-04-29 VITALS — BP 120/72 | HR 72 | Temp 97.5°F | Ht 69.0 in | Wt 210.4 lb

## 2024-04-29 DIAGNOSIS — K219 Gastro-esophageal reflux disease without esophagitis: Secondary | ICD-10-CM

## 2024-04-29 DIAGNOSIS — M542 Cervicalgia: Secondary | ICD-10-CM | POA: Diagnosis not present

## 2024-04-29 DIAGNOSIS — F909 Attention-deficit hyperactivity disorder, unspecified type: Secondary | ICD-10-CM

## 2024-04-29 DIAGNOSIS — G8929 Other chronic pain: Secondary | ICD-10-CM

## 2024-04-29 DIAGNOSIS — I10 Essential (primary) hypertension: Secondary | ICD-10-CM | POA: Diagnosis not present

## 2024-04-29 DIAGNOSIS — F411 Generalized anxiety disorder: Secondary | ICD-10-CM

## 2024-04-29 MED ORDER — BACLOFEN 10 MG PO TABS: ORAL_TABLET | ORAL | 1 refills | Status: DC

## 2024-04-29 MED ORDER — OMEPRAZOLE 20 MG PO CPDR: DELAYED_RELEASE_CAPSULE | ORAL | 3 refills | Status: AC

## 2024-04-29 MED ORDER — SERTRALINE HCL 50 MG PO TABS: 50.0000 mg | ORAL_TABLET | Freq: Every day | ORAL | 3 refills | Status: AC

## 2024-04-29 MED ORDER — TRAMADOL HCL 50 MG PO TABS: 50.0000 mg | ORAL_TABLET | Freq: Every day | ORAL | 2 refills | Status: DC | PRN

## 2024-04-29 MED ORDER — LISDEXAMFETAMINE DIMESYLATE 20 MG PO CAPS
20.0000 mg | ORAL_CAPSULE | Freq: Every morning | ORAL | 0 refills | Status: DC
Start: 2024-04-29 — End: 2024-07-27

## 2024-04-29 MED ORDER — LISINOPRIL 40 MG PO TABS: 40.0000 mg | ORAL_TABLET | Freq: Every day | ORAL | 3 refills | Status: AC

## 2024-04-29 NOTE — Assessment & Plan Note (Signed)
 Chronic, suboptimal control.  Controlled substance contract signed today. Stop Adderall XR 20 mg.  Start Vyvanse  20 mg daily, titrate Counseled on cardiovascular risk of stimulant medication.

## 2024-04-29 NOTE — Progress Notes (Signed)
 Assessment & Plan:  Primary hypertension Assessment & Plan: Chronic, excellent control.  Continue Coreg  12.5 mg twice daily, lisinopril  40 mg daily   Chronic neck pain Assessment & Plan: Chronic, stable.  Controlled substance contract signed today.  Continue tramadol  50 to 100 mg nightly.  Orders: -     Baclofen ; TAKE 1 TABLET BY MOUTH AT BEDTIME AS NEEDED FOR MUSCLE SPASMS.  Dispense: 90 tablet; Refill: 1 -     traMADol  HCl; Take 1-2 tablets (50-100 mg total) by mouth daily as needed for moderate pain (pain score 4-6).  Dispense: 60 tablet; Refill: 2  Gastroesophageal reflux disease, unspecified whether esophagitis present -     Omeprazole ; TAKE 1 CAPSULE BY MOUTH EVERY DAY  Dispense: 90 capsule; Refill: 3  Anxiety state -     Sertraline  HCl; Take 1 tablet (50 mg total) by mouth daily.  Dispense: 90 tablet; Refill: 3  Attention deficit hyperactivity disorder (ADHD), unspecified ADHD type Assessment & Plan: Chronic, suboptimal control.  Controlled substance contract signed today. Stop Adderall XR 20 mg.  Start Vyvanse  20 mg daily, titrate Counseled on cardiovascular risk of stimulant medication.   Other orders -     Lisinopril ; Take 1 tablet (40 mg total) by mouth daily.  Dispense: 90 tablet; Refill: 3 -     Lisdexamfetamine Dimesylate ; Take 1 capsule (20 mg total) by mouth every morning.  Dispense: 30 capsule; Refill: 0     Return precautions given.   Risks, benefits, and alternatives of the medications and treatment plan prescribed today were discussed, and patient expressed understanding.   Education regarding symptom management and diagnosis given to patient on AVS either electronically or printed.  No follow-ups on file.  Bascom Bossier, FNP  Subjective:    Patient ID: Austin [name], male    DOB: 06-17-1966, 58 y.o.   MRN: [id number]  CC: LOPAKA KARGE is a 58 y.o. male who presents today for follow up.   HPI: He is compliant with Adderall XR 20 mg daily.  He  would like to change back to Vyvanse . Adderall has a 'crash effect' for him.  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   Denies chest pain, shortness of breath, increased anxiety.  Right middle trigger Worse after using mouse with computer or when gets up from work  No numbness right arm, swelling   He had been on Vyvanse  40mg  09/2022  Allergies: Testosterone and Vitamin b1 [thiamine] Current Outpatient Medications on File Prior to Visit  Medication Sig Dispense Refill   carvedilol  (COREG ) 12.5 MG tablet TAKE 1 TABLET (12.5MG  TOTAL) BY MOUTH TWICE A DAY WITH MEALS 180 tablet 1   meloxicam  (MOBIC ) 15 MG tablet Take 1 tablet (15 mg total) by mouth daily as needed for pain. Take with food, use sparingly 90 tablet 1   benzonatate  (TESSALON ) 200 MG capsule Take 1 capsule (200 mg total) by mouth 2 (two) times daily as needed for cough. (Patient not taking: Reported on 04/29/2024) 20 capsule 0   No current facility-administered medications on file prior to visit.    Review of Systems  Constitutional:  Negative for chills and fever.  Respiratory:  Negative for cough.   Cardiovascular:  Negative for chest pain and palpitations.  Gastrointestinal:  Negative for nausea and vomiting.      Objective:    BP 120/72   Pulse 72   Temp (!) 97.5 F (36.4 C) (Oral)   Ht 5'  9 (1.753 m)   Wt 210 lb 6.4 oz (95.4 kg)   SpO2 95%   BMI 31.07 kg/m  BP Readings from Last 3 Encounters:  04/29/24 120/72  01/22/24 120/70  10/17/23 118/76   Wt Readings from Last 3 Encounters:  04/29/24 210 lb 6.4 oz (95.4 kg)  01/22/24 214 lb 6.4 oz (97.3 kg)  10/17/23 210 lb (95.3 kg)    Physical Exam Vitals reviewed.  Constitutional:      Appearance: He is well-developed.   Cardiovascular:     Rate and Rhythm: Regular rhythm.     Heart sounds: Normal heart sounds.  Pulmonary:     Effort: Pulmonary effort is normal. No respiratory distress.     Breath sounds:  Normal breath sounds. No wheezing, rhonchi or rales.   Skin:    General: Skin is warm and dry.   Neurological:     Mental Status: He is alert.   Psychiatric:        Speech: Speech normal.        Behavior: Behavior normal.

## 2024-04-29 NOTE — Telephone Encounter (Signed)
 Pharmacy Patient Advocate Encounter   Received notification from Patient Pharmacy that prior authorization for Lisdexamfetamine Dimesylate  20MG  capsules is required/requested.   Insurance verification completed.   The patient is insured through [facility] .   Per test claim: PA required; PA started via CoverMyMeds. KEY [id number] . Waiting for clinical questions to populate.

## 2024-04-29 NOTE — Assessment & Plan Note (Signed)
 Chronic, excellent control.  Continue Coreg  12.5 mg twice daily, lisinopril  40 mg daily

## 2024-04-29 NOTE — Assessment & Plan Note (Signed)
 Chronic, stable.  Controlled substance contract signed today.  Continue tramadol  50 to 100 mg nightly.

## 2024-04-30 NOTE — Telephone Encounter (Signed)
 Clinical questions have been answered and PA submitted. PA currently Pending. Please be advised that most companies allow up to 30 days to make a decision. We will advise when a determination has been made, or follow up in 1 week.   Please reach out to our team, Rx Prior Auth Pool, if you haven't heard back in a week.

## 2024-04-30 NOTE — Telephone Encounter (Signed)
 Pharmacy Patient Advocate Encounter  Received notification from [facility] that Prior Authorization for Lisdexamfetamine Dimesylate  20MG  capsules has been APPROVED from 04/30/2024 to 05/01/2027   PA #/Case ID/Reference #: [id number]

## 2024-05-03 ENCOUNTER — Encounter: Payer: Self-pay | Admitting: Family

## 2024-05-03 NOTE — Telephone Encounter (Signed)
 Called pharmacy rx is ready for pickup. Pot has been notified

## 2024-05-18 ENCOUNTER — Encounter: Payer: Self-pay | Admitting: Family

## 2024-05-18 ENCOUNTER — Other Ambulatory Visit: Payer: Self-pay

## 2024-05-18 DIAGNOSIS — I10 Essential (primary) hypertension: Secondary | ICD-10-CM

## 2024-05-18 MED ORDER — CARVEDILOL 12.5 MG PO TABS: ORAL_TABLET | ORAL | 1 refills | Status: DC

## 2024-05-19 ENCOUNTER — Other Ambulatory Visit: Payer: Self-pay | Admitting: Family

## 2024-05-19 DIAGNOSIS — M65331 Trigger finger, right middle finger: Secondary | ICD-10-CM

## 2024-05-19 DIAGNOSIS — I10 Essential (primary) hypertension: Secondary | ICD-10-CM

## 2024-05-19 MED ORDER — CARVEDILOL 12.5 MG PO TABS: ORAL_TABLET | ORAL | 3 refills | Status: AC

## 2024-05-27 ENCOUNTER — Other Ambulatory Visit: Payer: Self-pay | Admitting: Family

## 2024-05-27 DIAGNOSIS — G8929 Other chronic pain: Secondary | ICD-10-CM

## 2024-05-27 NOTE — Telephone Encounter (Signed)
 Pharmacy comment: Alternative Requested:THE PRESCRIBED MEDICATION IS NOT COVERED BY INSURANCE. PLEASE CONSIDER CHANGING TO ONE OF THE SUGGESTED COVERED ALTERNATIVES.

## 2024-05-28 ENCOUNTER — Telehealth: Payer: Self-pay | Admitting: Family

## 2024-05-28 ENCOUNTER — Other Ambulatory Visit: Payer: Self-pay | Admitting: Family

## 2024-05-28 MED ORDER — TIZANIDINE HCL 2 MG PO CAPS: 2.0000 mg | ORAL_CAPSULE | Freq: Every evening | ORAL | 1 refills | Status: DC | PRN

## 2024-05-28 NOTE — Telephone Encounter (Signed)
 Call pt Baclofen  not approved by insurance and will cover tizanidine , I have sent in.   It is also a muscle relaxant Do not drive or operate heavy machinery while on muscle relaxant. Please do not drink alcohol. Only take this medication as needed for acute muscle spasm at bedtime. This medication make you feel drowsy so be very careful.  Stop taking if become too drowsy or somnolent as this puts you at risk for falls. Please contact our office with any questions.

## 2024-05-28 NOTE — Telephone Encounter (Signed)
 Left message to return call to our office.

## 2024-05-31 NOTE — Telephone Encounter (Signed)
 Called pt and advised him of this information. He stated that he only takes things like that at bed time.

## 2024-06-01 ENCOUNTER — Other Ambulatory Visit: Payer: Self-pay | Admitting: Family

## 2024-06-01 DIAGNOSIS — G8929 Other chronic pain: Secondary | ICD-10-CM

## 2024-06-05 ENCOUNTER — Emergency Department

## 2024-06-05 ENCOUNTER — Other Ambulatory Visit: Payer: Self-pay

## 2024-06-05 ENCOUNTER — Emergency Department
Admission: EM | Admit: 2024-06-05 | Discharge: 2024-06-05 | Disposition: A | Discharge status: Home / Self Care | Attending: Emergency Medicine | Admitting: Emergency Medicine

## 2024-06-05 DIAGNOSIS — I10 Essential (primary) hypertension: Secondary | ICD-10-CM | POA: Insufficient documentation

## 2024-06-05 DIAGNOSIS — S76911A Strain of unspecified muscles, fascia and tendons at thigh level, right thigh, initial encounter: Secondary | ICD-10-CM | POA: Diagnosis not present

## 2024-06-05 DIAGNOSIS — X509XXA Other and unspecified overexertion or strenuous movements or postures, initial encounter: Secondary | ICD-10-CM | POA: Insufficient documentation

## 2024-06-05 DIAGNOSIS — M79651 Pain in right thigh: Secondary | ICD-10-CM

## 2024-06-05 DIAGNOSIS — R1031 Right lower quadrant pain: Secondary | ICD-10-CM | POA: Insufficient documentation

## 2024-06-05 DIAGNOSIS — T148XXA Other injury of unspecified body region, initial encounter: Secondary | ICD-10-CM

## 2024-06-05 DIAGNOSIS — S79921A Unspecified injury of right thigh, initial encounter: Secondary | ICD-10-CM | POA: Diagnosis present

## 2024-06-05 MED ORDER — OXYCODONE-ACETAMINOPHEN 5-325 MG PO TABS
1.0000 | ORAL_TABLET | Freq: Four times a day (QID) | ORAL | 0 refills | Status: DC | PRN
Start: 2024-06-05 — End: 2024-08-05

## 2024-06-05 MED ORDER — OXYCODONE-ACETAMINOPHEN 5-325 MG PO TABS
2.0000 | ORAL_TABLET | ORAL | Status: AC
  Administered 2024-06-05: 2 via ORAL
  Filled 2024-06-05: qty 2

## 2024-06-05 MED ORDER — KETOROLAC TROMETHAMINE 30 MG/ML IJ SOLN
30.0000 mg | Freq: Once | INTRAMUSCULAR | Status: AC
  Administered 2024-06-05: 30 mg via INTRAMUSCULAR
  Filled 2024-06-05: qty 1

## 2024-06-05 NOTE — ED Triage Notes (Signed)
 Says he got a lawnmower stuck in the ditch this past Monday. Attempted to remove it and fell injuring lower back.   This morning was straining and began having right groin pain with subjective lump.

## 2024-06-05 NOTE — Discharge Instructions (Signed)
 Follow-up with Dr. Cleotilde or [facility].   Please return to the emergency room right away if you are to develop a fever, severe nausea, your pain becomes severe or worsens, you are unable to keep food down, begin vomiting any dark or bloody fluid, testicaular swelling, you develop any dark or bloody stools, feel dehydrated, or other new concerns or symptoms arise.

## 2024-06-05 NOTE — ED Provider Notes (Signed)
 [facility] Provider Note    Event Date/Time   First MD Initiated Contact with Patient 06/05/24 [phone]     (approximate)   History   Back Pain and Groin Pain   HPI  Austin [name] is a 58 y.o. male history of hypertension and thyroid  disease  Patient reports he sat soreness of his lower back right side right thigh area since getting a lawnmower stuck and he had a neighbor strain to loosen it and freed it.  He has been feeling intermittently up area of sharp pain in his right groin area that will radiate some towards the upper thigh.  Denies testicular swelling, but reports sometimes the pain radiates towards the right testicular area and right lower back.  No nausea or vomiting no abdominal symptoms.  Does take tramadol  at night for generalized aches and pains  Pain is alleviated by sitting still.  Worsened by movements trying to lift the right leg set himself up or hold himself in an upright position     Physical Exam   Triage Vital Signs: ED Triage Vitals  Encounter Vitals Group     BP 06/05/24 0701 (!) 137/99     Girls Systolic BP Percentile --      Girls Diastolic BP Percentile --      Boys Systolic BP Percentile --      Boys Diastolic BP Percentile --      Pulse Rate 06/05/24 0701 67     Resp 06/05/24 0701 20     Temp 06/05/24 0701 (!) 96.5 F (35.8 C)     Temp Source 06/05/24 0701 Oral     SpO2 06/05/24 0701 100 %     Weight 06/05/24 0701 207 lb (93.9 kg)     Height 06/05/24 0701 5' 9 (1.753 m)     Head Circumference --      Peak Flow --      Pain Score 06/05/24 0700 10     Pain Loc --      Pain Education --      Exclude from Growth Chart --     Most recent vital signs: Vitals:   06/05/24 0701  BP: (!) 137/99  Pulse: 67  Resp: 20  Temp: (!) 96.5 F (35.8 C)  SpO2: 100%     General: Awake, no distress.  Appears in no distress and reports no pain or symptoms when laying flat.  When I attempt to have him sit up though he requires  assistance in sitting reports quite a bit of pain radiating across the right anterior thigh and some of the right lower back CV:  Good peripheral perfusion.  Strong well-perfused lower extremities bilaterally.  Demonstrates 5-5 strength with lower extremities bilaterally but some limitation with particularly flexion type movements are on the right hip and anterior thigh. Resp:  Normal effort.  Normal Abd:  No distention.  Soft nontender nondistended.  No obvious groin masses.  Also the patient's stand, there is no testicular pain or discomfort no testicular swelling.  Palpation of the right inguinal canal does not demonstrate any obvious mass or tenderness even with bearing down.  However the patient reports that at times the pain runs through this area but seems to radiate more further down from lateral to medial over the right anterior leg/quadricep region.  Other:     ED Results / Procedures / Treatments   Labs (all labs ordered are listed, but only abnormal results are displayed) Labs Reviewed -  No data to display   EKG     RADIOLOGY  CT imaging performed to evaluate for potential cause of radiating pain to the right thigh including cause such as small hernia, kidney stone, abnormal lesion or referred pain.   CT ABDOMEN PELVIS WO CONTRAST Result Date: 06/05/2024 CLINICAL DATA:  Right groin pain. EXAM: CT ABDOMEN AND PELVIS WITHOUT CONTRAST TECHNIQUE: Multidetector CT imaging of the abdomen and pelvis was performed following the standard protocol without IV contrast. RADIATION DOSE REDUCTION: This exam was performed according to the departmental dose-optimization program which includes automated exposure control, adjustment of the mA and/or kV according to patient size and/or use of iterative reconstruction technique. COMPARISON:  None Available. FINDINGS: Lower chest: Unremarkable. Hepatobiliary: No suspicious focal abnormality in the liver on this study without intravenous contrast.  Possible tiny noncalcified gallstones (image 25/2). Gallbladder otherwise unremarkable. No intrahepatic or extrahepatic biliary dilation. Pancreas: No focal mass lesion. No dilatation of the main duct. No intraparenchymal cyst. No peripancreatic edema. Spleen: No splenomegaly. No suspicious focal mass lesion. Adrenals/Urinary Tract: No adrenal nodule or mass. Kidneys unremarkable. No evidence for hydroureter. The urinary bladder appears normal for the degree of distention. Stomach/Bowel: Stomach is unremarkable. No gastric wall thickening. No evidence of outlet obstruction. Duodenum is normally positioned as is the ligament of Treitz. No small bowel wall thickening. No small bowel dilatation. The terminal ileum is normal. The appendix is normal. No gross colonic mass. No colonic wall thickening. Diverticular changes are noted in the left colon without evidence of diverticulitis. Vascular/Lymphatic: There is mild atherosclerotic calcification of the abdominal aorta without aneurysm. There is no gastrohepatic or hepatoduodenal ligament lymphadenopathy. No retroperitoneal or mesenteric lymphadenopathy. No pelvic sidewall lymphadenopathy. Reproductive: The prostate gland and seminal vesicles are unremarkable. Other: No intraperitoneal free fluid. Musculoskeletal: No umbilical hernia. No groin hernia. No ventral hernia. No worrisome lytic or sclerotic osseous abnormality. IMPRESSION: 1. No acute findings in the abdomen or pelvis. Specifically, no findings to explain the patient's history of right groin pain. 2. Possible tiny noncalcified gallstones. 3. Left colonic diverticulosis without diverticulitis. 4.  Aortic Atherosclerosis (ICD10-I70.0). Electronically Signed   By: Camellia Candle M.D.   On: 06/05/2024 08:25      PROCEDURES:  Critical Care performed: No  Procedures   MEDICATIONS ORDERED IN ED: Medications  ketorolac  (TORADOL ) 30 MG/ML injection 30 mg (30 mg Intramuscular Given 06/05/24 0742)   oxyCODONE -acetaminophen  (PERCOCET/ROXICET) 5-325 MG per tablet 2 tablet (2 tablets Oral Given 06/05/24 0742)     IMPRESSION / MDM / ASSESSMENT AND PLAN / ED COURSE  I reviewed the triage vital signs and the nursing notes.                              Differential diagnosis includes, but is not limited to, probable musculoskeletal very reproducible sitting moving bending.  No loss of neurologic function.  Strong normal vascular examination.  Pain very reproducible with movements.  Query if this could be a sprain or potential tear of a quadriceps or sartorius.  There is no obvious bulging or mass but the patient does report he feels a little swollen particularly over the area of the sartorius musculature or insertions of the quadriceps anteriorly.  Reassuring inguinal examination.  Discussed with patient agreeable with Toradol  and oxycodone  which she is reports he has used in the past.  Would hold off on use of tramadol  for the next few days if he is needing to  use something stronger such as oxycodone  for pain relief.  Patient's presentation is most consistent with acute complicated illness / injury requiring diagnostic workup.      Clinical Course as of 06/05/24 0920  Sat Jun 05, 2024  0842 Pain improved.  Resting comfortably.  Discussed with patient his reassuring CT.  At this juncture highly suspicious for musculoskeletal cause.  No neurovascular symptoms.  Patient comfortable with plan to discharge with oxycodone  as needed, use of ibuprofen OTC, and follow-up with [facility] whom he is seen in the past including identifies Dr. Cleotilde as good point of contact. [MQ]  [phone] Patient will not drive while taking oxycodone  or this morning, he will also discontinue use of tramadol  while taking oxycodone .  He is awake alert oriented pain improved, suggested that he may wish to minimize activity [MQ]  0843 And or any strenuous activities to be minimized and may benefit from use of a cane  temporarily.  Follow-up closely with orthopedics and primary care which patient is agreeable with.  Discussed careful return precautions including any severe pain, worsening of condition, scrotal or testicular swelling, groin pain, weakness etc. return to the ER right away [MQ]    Clinical Course User Index [MQ] Dicky Anes, MD   Return precautions and treatment recommendations and follow-up discussed with the patient who is agreeable with the plan.  I will prescribe the patient a narcotic pain medicine due to their condition which I anticipate will cause at least moderate pain short term. I discussed with the patient safe use of narcotic pain medicines, and that they are not to drive or ever take more than prescribed (no more than 1 pill every 6 hours). We discussed that this is the type of medication that can be  overdosed on and the risks of this type of medicine. Patient is very agreeable to only use as prescribed and to never use more than prescribed. Will not use tramadol  when taking oxycodone .    FINAL CLINICAL IMPRESSION(S) / ED DIAGNOSES   Final diagnoses:  Acute pain of right thigh  Muscle strain     Rx / DC Orders   ED Discharge Orders          Ordered    oxyCODONE -acetaminophen  (PERCOCET/ROXICET) 5-325 MG tablet  Every 6 hours PRN        06/05/24 0919             Note:  This document was prepared using Dragon voice recognition software and may include unintentional dictation errors.   Dicky Anes, MD 06/05/24 [phone]

## 2024-06-08 ENCOUNTER — Other Ambulatory Visit: Payer: Self-pay | Admitting: Family

## 2024-07-26 ENCOUNTER — Encounter: Payer: Self-pay | Admitting: Family

## 2024-07-27 ENCOUNTER — Other Ambulatory Visit: Payer: Self-pay | Admitting: Family

## 2024-07-27 MED ORDER — LISDEXAMFETAMINE DIMESYLATE 20 MG PO CAPS: 20.0000 mg | ORAL_CAPSULE | Freq: Every day | ORAL | 0 refills | Status: DC

## 2024-07-27 MED ORDER — LISDEXAMFETAMINE DIMESYLATE 20 MG PO CAPS: 20.0000 mg | ORAL_CAPSULE | Freq: Every morning | ORAL | 0 refills | Status: DC

## 2024-08-05 ENCOUNTER — Ambulatory Visit: Payer: Self-pay | Admitting: Family

## 2024-08-05 ENCOUNTER — Encounter: Payer: Self-pay | Admitting: Family

## 2024-08-05 ENCOUNTER — Ambulatory Visit: Admitting: Family

## 2024-08-05 VITALS — BP 118/76 | HR 71 | Temp 98.0°F | Ht 69.0 in | Wt 201.4 lb

## 2024-08-05 DIAGNOSIS — Z13220 Encounter for screening for lipoid disorders: Secondary | ICD-10-CM | POA: Diagnosis not present

## 2024-08-05 DIAGNOSIS — Z136 Encounter for screening for cardiovascular disorders: Secondary | ICD-10-CM

## 2024-08-05 DIAGNOSIS — M542 Cervicalgia: Secondary | ICD-10-CM

## 2024-08-05 DIAGNOSIS — K219 Gastro-esophageal reflux disease without esophagitis: Secondary | ICD-10-CM

## 2024-08-05 DIAGNOSIS — Z125 Encounter for screening for malignant neoplasm of prostate: Secondary | ICD-10-CM | POA: Diagnosis not present

## 2024-08-05 DIAGNOSIS — Z87898 Personal history of other specified conditions: Secondary | ICD-10-CM | POA: Diagnosis not present

## 2024-08-05 DIAGNOSIS — M545 Low back pain, unspecified: Secondary | ICD-10-CM | POA: Insufficient documentation

## 2024-08-05 DIAGNOSIS — G8929 Other chronic pain: Secondary | ICD-10-CM

## 2024-08-05 DIAGNOSIS — I10 Essential (primary) hypertension: Secondary | ICD-10-CM

## 2024-08-05 DIAGNOSIS — M5441 Lumbago with sciatica, right side: Secondary | ICD-10-CM

## 2024-08-05 DIAGNOSIS — F909 Attention-deficit hyperactivity disorder, unspecified type: Secondary | ICD-10-CM

## 2024-08-05 LAB — POCT GLYCOSYLATED HEMOGLOBIN (HGB A1C): HbA1c POC (<> result, manual entry): 5.6 % (ref 4.0–5.6)

## 2024-08-05 NOTE — Assessment & Plan Note (Signed)
 Chronic, stable. Continue Vyvanse  20 mg daily

## 2024-08-05 NOTE — Progress Notes (Signed)
 Assessment & Plan:  History of prediabetes -     POCT glycosylated hemoglobin (Hb A1C)  Primary hypertension Assessment & Plan: Chronic, excellent control.  Continue Coreg  12.5 mg twice daily, lisinopril  40 mg daily   Screening for prostate cancer  Encounter for lipid screening for cardiovascular disease  Gastroesophageal reflux disease, unspecified whether esophagitis present  Chronic neck pain  Chronic right-sided low back pain with right-sided sciatica Assessment & Plan: Acute on chronic, improved after ESI and trigger point injection.  MRI lumbar obtained by [facility].  Discussed with patient use of meloxicam  and risk of gastritis, peptic ulcer disease.  He will continue omeprazole .  May continue Zanaflex  2 to 4 mg at bedtime as needed.  Advised consideration of gabapentin 300 mg nightly as prescribed by [facility] ongoing for continued pain management.  He will wean until he is doing.   Attention deficit hyperactivity disorder (ADHD), unspecified ADHD type Assessment & Plan: Chronic, stable. Continue Vyvanse  20 mg daily       Return precautions given.   Risks, benefits, and alternatives of the medications and treatment plan prescribed today were discussed, and patient expressed understanding.   Education regarding symptom management and diagnosis given to patient on AVS either electronically or printed.  Return in about 2 months (around 10/05/2024).  Rollene Northern, FNP  Subjective:    Patient ID: Austin [name], male    DOB: [birth year], 58 y.o.   MRN: [id number]  CC: Austin [name] is a 58 y.o. male who presents today for follow up.   HPI: Low back pain has improved in severity after ESI, corticosteroid injection, Medrol  Dosepak.    He has weaned off of gabapentin 300mg  TID. He is foregoing surgery at this time.  He has some right leg pain, low back pain with long periods of sitting.  Exquisite pain has resolved.  He had an MRI lumbar with [facility].   Episodic right thigh numbness   He takes meloxicam  15 mg daily, tizanidine  2 to 4 mg nightly.  He uses tramadol  50 -100 mg daily as needed. Following with [facility], last seen 07/16/2024, Dr Dow; s/p [facility] 06/30/24 and SI joint injection 07/20/24  He is doing well on Vyvanse  20 mg every day.  Medication dose is working well for him.  No history of peptic ulcer.  He remains compliant with omeprazole .  Denies urinary or fecal incontinence. Allergies: Testosterone and Vitamin b1 [thiamine] Current Outpatient Medications on File Prior to Visit  Medication Sig Dispense Refill   carvedilol  (COREG ) 12.5 MG tablet Take one 12.5 mg tablet by mouth twice a day with meals 180 tablet 3   gabapentin (NEURONTIN) 300 MG capsule Take 300 mg by mouth at bedtime.     lisdexamfetamine (VYVANSE ) 20 MG capsule Take 1 capsule (20 mg total) by mouth every morning. 30 capsule 0   lisdexamfetamine (VYVANSE ) 20 MG capsule Take 1 capsule (20 mg total) by mouth daily. 30 capsule 0   lisdexamfetamine (VYVANSE ) 20 MG capsule Take 1 capsule (20 mg total) by mouth daily. 30 capsule 0   lisinopril  (ZESTRIL ) 40 MG tablet Take 1 tablet (40 mg total) by mouth daily. 90 tablet 3   meloxicam  (MOBIC ) 15 MG tablet TAKE 1 TABLET (15 MG TOTAL) BY MOUTH DAILY AS NEEDED FOR PAIN. TAKE WITH FOOD, USE SPARINGLY 90 tablet 1   omeprazole  (PRILOSEC) 20 MG capsule TAKE 1 CAPSULE BY MOUTH EVERY DAY 90 capsule 3   sertraline  (ZOLOFT ) 50 MG tablet Take 1 tablet (50 mg  total) by mouth daily. 90 tablet 3   tizanidine  (ZANAFLEX ) 2 MG capsule TAKE 1-2 CAPSULES (2-4 MG TOTAL) BY MOUTH AT BEDTIME AS NEEDED FOR MUSCLE SPASMS. 180 capsule 1   traMADol  (ULTRAM ) 50 MG tablet Take 1-2 tablets (50-100 mg total) by mouth daily as needed for moderate pain (pain score 4-6). 60 tablet 2   No current facility-administered medications on file prior to visit.    Review of Systems  Constitutional:  Negative for chills and fever.  Respiratory:  Negative for  cough.   Cardiovascular:  Negative for chest pain and palpitations.  Gastrointestinal:  Negative for nausea and vomiting.  Musculoskeletal:  Positive for back pain.  Neurological:  Positive for numbness.      Objective:    BP 118/76   Pulse 71   Temp 98 F (36.7 C) (Oral)   Ht 5' 9 (1.753 m)   Wt 201 lb 6.4 oz (91.4 kg)   SpO2 97%   BMI 29.74 kg/m  BP Readings from Last 3 Encounters:  08/05/24 118/76  06/05/24 120/71  04/29/24 120/72   Wt Readings from Last 3 Encounters:  08/05/24 201 lb 6.4 oz (91.4 kg)  06/05/24 207 lb (93.9 kg)  04/29/24 210 lb 6.4 oz (95.4 kg)    Physical Exam Vitals reviewed.  Constitutional:      Appearance: He is well-developed.  Cardiovascular:     Rate and Rhythm: Regular rhythm.     Heart sounds: Normal heart sounds.  Pulmonary:     Effort: Pulmonary effort is normal. No respiratory distress.     Breath sounds: Normal breath sounds. No wheezing or rales.  Musculoskeletal:     Lumbar back: No swelling, spasms or tenderness. Normal range of motion. Negative right straight leg raise test and negative left straight leg raise test.     Comments: Full range of motion with flexion, extension, lateral side bends. No pain, numbness, tingling elicited with single leg raise bilaterally. No rash.  Skin:    General: Skin is warm and dry.  Neurological:     Mental Status: He is alert.  Psychiatric:        Speech: Speech normal.        Behavior: Behavior normal.

## 2024-08-05 NOTE — Assessment & Plan Note (Signed)
 Chronic, excellent control.  Continue Coreg  12.5 mg twice daily, lisinopril  40 mg daily

## 2024-08-05 NOTE — Patient Instructions (Signed)
 It is quite reasonable to stay on gabapentin 300 mg at bedtime for pain control, maintenance.  Please let me know how you are doing.

## 2024-08-05 NOTE — Assessment & Plan Note (Signed)
 Acute on chronic, improved after ESI and trigger point injection.  MRI lumbar obtained by [facility].  Discussed with patient use of meloxicam  and risk of gastritis, peptic ulcer disease.  He will continue omeprazole .  May continue Zanaflex  2 to 4 mg at bedtime as needed.  Advised consideration of gabapentin 300 mg nightly as prescribed by [facility] ongoing for continued pain management.  He will wean until he is doing.

## 2024-11-16 ENCOUNTER — Other Ambulatory Visit: Payer: Self-pay | Admitting: Family

## 2024-11-16 NOTE — Telephone Encounter (Unsigned)
 Copied from CRM [id number]. Topic: Clinical - Medication Refill >> Nov 16, 2024  5:01 PM Delon T wrote: Medication: gabapentin (NEURONTIN) 300 MG capsule  Has the patient contacted their pharmacy? No (Agent: If no, request that the patient contact the pharmacy for the refill. If patient does not wish to contact the pharmacy document the reason why and proceed with request.) (Agent: If yes, when and what did the pharmacy advise?)  This is the patient's preferred pharmacy:  [id number] [place], [place] - [address] [place] [address] Phone: [phone] Fax: [phone]    Is this the correct pharmacy for this prescription? Yes If no, delete pharmacy and type the correct one.   Has the prescription been filled recently? Yes  Is the patient out of the medication? Yes  Has the patient been seen for an appointment in the last year OR does the patient have an upcoming appointment? Yes  Can we respond through MyChart? Yes  Agent: Please be advised that Rx refills may take up to 3 business days. We ask that you follow-up with your pharmacy.

## 2024-11-18 ENCOUNTER — Ambulatory Visit: Admitting: Family

## 2024-11-18 ENCOUNTER — Encounter: Payer: Self-pay | Admitting: Family

## 2024-11-18 VITALS — BP 124/78 | HR 67 | Temp 98.5°F | Ht 69.0 in | Wt 214.2 lb

## 2024-11-18 DIAGNOSIS — G8929 Other chronic pain: Secondary | ICD-10-CM

## 2024-11-18 DIAGNOSIS — M542 Cervicalgia: Secondary | ICD-10-CM

## 2024-11-18 DIAGNOSIS — F909 Attention-deficit hyperactivity disorder, unspecified type: Secondary | ICD-10-CM | POA: Diagnosis not present

## 2024-11-18 MED ORDER — TIZANIDINE HCL 4 MG PO CAPS: 4.0000 mg | ORAL_CAPSULE | Freq: Every day | ORAL | 3 refills | Status: AC

## 2024-11-18 MED ORDER — LISDEXAMFETAMINE DIMESYLATE 20 MG PO CAPS: 20.0000 mg | ORAL_CAPSULE | Freq: Every day | ORAL | 0 refills | Status: AC

## 2024-11-18 MED ORDER — GABAPENTIN 300 MG PO CAPS: 300.0000 mg | ORAL_CAPSULE | Freq: Two times a day (BID) | ORAL | 3 refills | Status: AC

## 2024-11-18 MED ORDER — LISDEXAMFETAMINE DIMESYLATE 20 MG PO CAPS: 20.0000 mg | ORAL_CAPSULE | Freq: Every morning | ORAL | 0 refills | Status: AC

## 2024-11-18 NOTE — Progress Notes (Signed)
 "  Assessment & Plan:  Attention deficit hyperactivity disorder (ADHD), unspecified ADHD type Assessment & Plan: Chronic, stable. Continue Vyvanse  20 mg daily.  Refills provided   Orders: -     Lisdexamfetamine  Dimesylate; Take 1 capsule (20 mg total) by mouth daily.  Dispense: 30 capsule; Refill: 0 -     Lisdexamfetamine  Dimesylate; Take 1 capsule (20 mg total) by mouth daily.  Dispense: 30 capsule; Refill: 0 -     Lisdexamfetamine  Dimesylate; Take 1 capsule (20 mg total) by mouth every morning.  Dispense: 30 capsule; Refill: 0  Chronic neck pain Assessment & Plan: Chronic, stable.  He is no longer on tramadol .  Continue meloxicam  15 mg daily.  He understands importance of continued daily use of PPI to protect against GI bleed, PUD.  He understands the risk of daily use of a potent NSAID.  Continue gabapentin  300 mg twice daily, tizanidine  4 mg nightly.  Orders: -     Gabapentin ; Take 1 capsule (300 mg total) by mouth 2 (two) times daily.  Dispense: 180 capsule; Refill: 3 -     tiZANidine  HCl; Take 1 capsule (4 mg total) by mouth at bedtime.  Dispense: 90 capsule; Refill: 3     Return precautions given.   Risks, benefits, and alternatives of the medications and treatment plan prescribed today were discussed, and patient expressed understanding.   Education regarding symptom management and diagnosis given to patient on AVS either electronically or printed.  No follow-ups on file.  Rollene Northern, FNP  Subjective:    Patient ID: Austin [name], male    DOB: [birth year], 59 y.o.   MRN: [id number]  CC: Austin [name] is a 59 y.o. male who presents today for follow up.   HPI: HPI Discussed the use of AI scribe software for clinical note transcription with the patient, who gave verbal consent to proceed.  History of Present Illness   Austin [name] is a 59 year old male who presents for a medication follow-up.  He has a history of chronic neck pain, previously managed with tramadol  at  night to aid sleep. He has not taken tramadol  recently and prefers a combination of gabapentin  300mg  BID and tizanidine  4mg  , which he finds more effective.   He is quite pleased with medication regimen .  tizanidine  4 mg nightly has significantly improved his sleep quality, allowing him to sleep longer without waking in pain.  He takes meloxicam  daily and notes that he can 'tell' when he misses a dose. He is also on omeprazole  daily.   He is on Vyvanse  20 mg every morning for ADHD which continues to work well for him       Allergies: Testosterone and Vitamin b1 [thiamine] Medications Ordered Prior to Encounter[1]  Review of Systems  Constitutional:  Negative for chills and fever.  Respiratory:  Negative for cough.   Cardiovascular:  Negative for chest pain and palpitations.  Gastrointestinal:  Negative for nausea and vomiting.  Musculoskeletal:  Positive for back pain and neck pain.      Objective:    BP 124/78   Pulse 67   Temp 98.5 F (36.9 C) (Oral)   Ht 5' 9 (1.753 m)   Wt 214 lb 3.2 oz (97.2 kg)   SpO2 98%   BMI 31.63 kg/m  BP Readings from Last 3 Encounters:  11/18/24 124/78  08/05/24 118/76  06/05/24 120/71   Wt Readings from Last 3 Encounters:  11/18/24 214 lb 3.2 oz (97.2 kg)  08/05/24 201 lb 6.4 oz (91.4 kg)  06/05/24 207 lb (93.9 kg)    Physical Exam Vitals reviewed.  Constitutional:      Appearance: He is well-developed.  Cardiovascular:     Rate and Rhythm: Regular rhythm.     Heart sounds: Normal heart sounds.  Pulmonary:     Effort: Pulmonary effort is normal. No respiratory distress.     Breath sounds: Normal breath sounds. No wheezing, rhonchi or rales.  Skin:    General: Skin is warm and dry.  Neurological:     Mental Status: He is alert.  Psychiatric:        Speech: Speech normal.        Behavior: Behavior normal.           [1]  Current Outpatient Medications on File Prior to Visit  Medication Sig Dispense Refill    carvedilol  (COREG ) 12.5 MG tablet Take one 12.5 mg tablet by mouth twice a day with meals 180 tablet 3   lisinopril  (ZESTRIL ) 40 MG tablet Take 1 tablet (40 mg total) by mouth daily. 90 tablet 3   meloxicam  (MOBIC ) 15 MG tablet TAKE 1 TABLET (15 MG TOTAL) BY MOUTH DAILY AS NEEDED FOR PAIN. TAKE WITH FOOD, USE SPARINGLY 90 tablet 1   omeprazole  (PRILOSEC) 20 MG capsule TAKE 1 CAPSULE BY MOUTH EVERY DAY 90 capsule 3   sertraline  (ZOLOFT ) 50 MG tablet Take 1 tablet (50 mg total) by mouth daily. 90 tablet 3   No current facility-administered medications on file prior to visit.   "

## 2024-11-18 NOTE — Assessment & Plan Note (Signed)
 Chronic, stable. Continue Vyvanse  20 mg daily.  Refills provided

## 2024-11-18 NOTE — Assessment & Plan Note (Signed)
 Chronic, stable.  He is no longer on tramadol .  Continue meloxicam  15 mg daily.  He understands importance of continued daily use of PPI to protect against GI bleed, PUD.  He understands the risk of daily use of a potent NSAID.  Continue gabapentin  300 mg twice daily, tizanidine  4 mg nightly.

## 2024-11-19 NOTE — Telephone Encounter (Signed)
 Austin [name] called this in on 11/18/24.

## 2025-01-10 ENCOUNTER — Other Ambulatory Visit

## 2025-02-21 ENCOUNTER — Ambulatory Visit: Admitting: Family
